# Patient Record
Sex: Female | Born: 1995 | Race: Black or African American | Hispanic: No | Marital: Single | Smoking: Never smoker
Health system: Southern US, Community
[De-identification: ages and names within clinical notes are randomized; demographics above are authoritative.]

## PROBLEM LIST (undated history)

## (undated) DIAGNOSIS — J45909 Unspecified asthma, uncomplicated: Secondary | ICD-10-CM

## (undated) DIAGNOSIS — T7840XA Allergy, unspecified, initial encounter: Secondary | ICD-10-CM

## (undated) DIAGNOSIS — F419 Anxiety disorder, unspecified: Secondary | ICD-10-CM

## (undated) HISTORY — PX: KNEE ARTHROSCOPY: SUR90

## (undated) HISTORY — DX: Allergy, unspecified, initial encounter: T78.40XA

## (undated) HISTORY — DX: Unspecified asthma, uncomplicated: J45.909

## (undated) HISTORY — DX: Anxiety disorder, unspecified: F41.9

---
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
## 2015-01-28 ENCOUNTER — Inpatient Hospital Stay: Payer: BLUE CROSS/BLUE SHIELD

## 2015-01-28 ENCOUNTER — Encounter (HOSPITAL_COMMUNITY): Payer: BLUE CROSS/BLUE SHIELD

## 2015-01-28 ENCOUNTER — Inpatient Hospital Stay
Admission: AD | Admit: 2015-01-28 | Discharge: 2015-02-04 | DRG: 865 | Disposition: A | Discharge status: Home / Self Care | Payer: BLUE CROSS/BLUE SHIELD | Source: Other Acute Inpatient Hospital | Attending: Internal Medicine | Admitting: Internal Medicine

## 2015-01-28 ENCOUNTER — Inpatient Hospital Stay: Payer: BLUE CROSS/BLUE SHIELD | Admitting: Critical Care Medicine

## 2015-01-28 ENCOUNTER — Other Ambulatory Visit: Payer: Self-pay

## 2015-01-28 DIAGNOSIS — J302 Other seasonal allergic rhinitis: Secondary | ICD-10-CM | POA: Diagnosis present

## 2015-01-28 DIAGNOSIS — G0490 Encephalitis and encephalomyelitis, unspecified: Secondary | ICD-10-CM

## 2015-01-28 DIAGNOSIS — Z781 Physical restraint status: Secondary | ICD-10-CM

## 2015-01-28 DIAGNOSIS — G92 Toxic encephalopathy: Secondary | ICD-10-CM | POA: Diagnosis present

## 2015-01-28 DIAGNOSIS — R569 Unspecified convulsions: Secondary | ICD-10-CM | POA: Diagnosis present

## 2015-01-28 DIAGNOSIS — J9600 Acute respiratory failure, unspecified whether with hypoxia or hypercapnia: Secondary | ICD-10-CM

## 2015-01-28 DIAGNOSIS — R339 Retention of urine, unspecified: Secondary | ICD-10-CM | POA: Diagnosis not present

## 2015-01-28 DIAGNOSIS — R451 Restlessness and agitation: Secondary | ICD-10-CM | POA: Diagnosis not present

## 2015-01-28 DIAGNOSIS — R197 Diarrhea, unspecified: Secondary | ICD-10-CM | POA: Diagnosis not present

## 2015-01-28 DIAGNOSIS — G039 Meningitis, unspecified: Secondary | ICD-10-CM

## 2015-01-28 DIAGNOSIS — J9811 Atelectasis: Secondary | ICD-10-CM | POA: Diagnosis present

## 2015-01-28 DIAGNOSIS — B2790 Infectious mononucleosis, unspecified without complication: Secondary | ICD-10-CM | POA: Diagnosis present

## 2015-01-28 DIAGNOSIS — B2782 Other infectious mononucleosis with meningitis: Principal | ICD-10-CM | POA: Diagnosis present

## 2015-01-28 DIAGNOSIS — D649 Anemia, unspecified: Secondary | ICD-10-CM | POA: Diagnosis present

## 2015-01-28 DIAGNOSIS — R4182 Altered mental status, unspecified: Secondary | ICD-10-CM

## 2015-01-28 LAB — BLOOD GAS, ARTERIAL
Arterial Total CO2: 21.2 mEq/L — ABNORMAL LOW (ref 24.0–30.0)
Base Excess, Arterial: -3.6 mEq/L — ABNORMAL LOW (ref <=2.0)
FIO2: 50 %
HCO3, Arterial: 20.2 mEq/L — ABNORMAL LOW (ref 23.0–29.0)
O2 Sat, Arterial: >100 % (ref 95.0–100.0)
PEEP: 5
Rate: 12
Temperature: 39.2
Tidal vol.: 420
pCO2, Arterial: 37.8 mmhg (ref 35.0–45.0)
pH, Arterial: 7.359 (ref 7.350–7.450)
pO2, Arterial: 250 mmhg — ABNORMAL HIGH (ref 80.0–90.0)

## 2015-01-28 LAB — CBC
Hematocrit: 38.8 % (ref 37.0–47.0)
Hgb: 13 g/dL (ref 12.0–16.0)
MCH: 28.1 pg (ref 28.0–32.0)
MCHC: 33.5 g/dL (ref 32.0–36.0)
MCV: 83.8 fL (ref 80.0–100.0)
MPV: 11.1 fL (ref 9.4–12.3)
Nucleated RBC: 0 /100 WBC (ref 0–1)
Platelets: 216 10*3/uL (ref 140–400)
RBC: 4.63 10*6/uL (ref 4.20–5.40)
RDW: 15 % (ref 12–15)
WBC: 13.62 10*3/uL — ABNORMAL HIGH (ref 3.50–10.80)

## 2015-01-28 LAB — BASIC METABOLIC PANEL
BUN: 8 mg/dL (ref 7.0–19.0)
CO2: 19 mEq/L — ABNORMAL LOW (ref 22–29)
Calcium: 8.9 mg/dL (ref 8.5–10.5)
Chloride: 108 mEq/L (ref 100–111)
Creatinine: 1 mg/dL (ref 0.6–1.0)
Glucose: 136 mg/dL — ABNORMAL HIGH (ref 70–100)
Potassium: 4.3 mEq/L (ref 3.5–5.1)
Sodium: 138 mEq/L (ref 136–145)

## 2015-01-28 LAB — HCG QUANTITATIVE: hCG, Quant.: <1.2

## 2015-01-28 LAB — LACTIC ACID, PLASMA: Lactic Acid: 2.8 mmol/L — ABNORMAL HIGH (ref 0.2–2.0)

## 2015-01-28 LAB — PT/INR
PT INR: 1.2 — ABNORMAL HIGH (ref 0.9–1.1)
PT: 15.2 s — ABNORMAL HIGH (ref 12.6–15.0)

## 2015-01-28 MED ORDER — PROPOFOL 10 MG/ML IV EMUL (WRAP)
INTRAVENOUS | Status: AC
Start: 2015-01-28 — End: 2015-01-28
  Administered 2015-01-28: 40 ug/kg/min via INTRAVENOUS
  Filled 2015-01-28: qty 100

## 2015-01-28 MED ORDER — VANCOMYCIN HCL 1000 MG IV SOLR
1000.0000 mg | Freq: Two times a day (BID) | INTRAVENOUS | Status: DC
Start: 2015-01-28 — End: 2015-01-28

## 2015-01-28 MED ORDER — PROPOFOL INFUSION 10 MG/ML
0.0000 ug/kg/min | INTRAVENOUS | Status: DC
Start: 2015-01-28 — End: 2015-01-30
  Administered 2015-01-28 – 2015-01-29 (×5): 40 ug/kg/min via INTRAVENOUS
  Administered 2015-01-29: 20 ug/kg/min via INTRAVENOUS
  Filled 2015-01-28 (×5): qty 100

## 2015-01-28 MED ORDER — LEVETIRACETAM 500 MG PO TABS
500.0000 mg | ORAL_TABLET | Freq: Two times a day (BID) | ORAL | Status: DC
Start: 2015-01-28 — End: 2015-01-28

## 2015-01-28 MED ORDER — SODIUM CHLORIDE 0.9 % IV SOLN
10.0000 mg/kg | Freq: Three times a day (TID) | INTRAVENOUS | Status: DC
Start: 2015-01-28 — End: 2015-02-04
  Administered 2015-01-28 – 2015-02-04 (×23): 550 mg via INTRAVENOUS
  Filled 2015-01-28 (×26): qty 11

## 2015-01-28 MED ORDER — SODIUM CHLORIDE 0.9 % IV SOLN
10.0000 mg/kg | Freq: Three times a day (TID) | INTRAVENOUS | Status: DC
Start: 2015-01-28 — End: 2015-01-28

## 2015-01-28 MED ORDER — HEPARIN SODIUM (PORCINE) 5000 UNIT/ML IJ SOLN
5000.0000 [IU] | Freq: Three times a day (TID) | INTRAMUSCULAR | Status: DC
Start: 2015-01-28 — End: 2015-01-29
  Administered 2015-01-28 – 2015-01-29 (×4): 5000 [IU] via SUBCUTANEOUS
  Filled 2015-01-28 (×4): qty 1

## 2015-01-28 MED ORDER — LORAZEPAM 2 MG/ML IJ SOLN
4.0000 mg | INTRAMUSCULAR | Status: DC | PRN
Start: 2015-01-28 — End: 2015-02-04

## 2015-01-28 MED ORDER — SODIUM CHLORIDE 0.9 % IV MBP
2.0000 g | Freq: Two times a day (BID) | INTRAVENOUS | Status: DC
Start: 2015-01-28 — End: 2015-01-29
  Administered 2015-01-28 (×2): 2 g via INTRAVENOUS
  Filled 2015-01-28 (×4): qty 2000

## 2015-01-28 MED ORDER — FAMOTIDINE 20 MG PO TABS
20.0000 mg | ORAL_TABLET | Freq: Two times a day (BID) | ORAL | Status: DC
Start: 2015-01-28 — End: 2015-01-31

## 2015-01-28 MED ORDER — FAMOTIDINE 10 MG/ML IV SOLN (WRAP)
20.0000 mg | Freq: Two times a day (BID) | INTRAVENOUS | Status: DC
Start: 2015-01-28 — End: 2015-01-31
  Administered 2015-01-28 – 2015-01-31 (×7): 20 mg via INTRAVENOUS
  Filled 2015-01-28 (×6): qty 2

## 2015-01-28 MED ORDER — CHLORHEXIDINE GLUCONATE 0.12 % MT SOLN
15.0000 mL | Freq: Two times a day (BID) | OROMUCOSAL | Status: DC
Start: 2015-01-28 — End: 2015-02-04
  Administered 2015-01-28 – 2015-01-31 (×6): 15 mL via OROMUCOSAL
  Filled 2015-01-28 (×9): qty 15

## 2015-01-28 MED ORDER — FENTANYL CITRATE (PF) 50 MCG/ML IJ SOLN (WRAP)
50.0000 ug | INTRAMUSCULAR | Status: DC | PRN
Start: 2015-01-28 — End: 2015-02-04
  Administered 2015-02-01: 50 ug via INTRAVENOUS
  Filled 2015-01-28: qty 2

## 2015-01-28 MED ORDER — LEVETIRACETAM IN NACL 500 MG/100ML IV SOLN
500.0000 mg | Freq: Two times a day (BID) | INTRAVENOUS | Status: DC
Start: 2015-01-28 — End: 2015-01-28

## 2015-01-28 MED ORDER — LEVETIRACETAM IN NACL 1000 MG/100ML IV SOLN
1000.0000 mg | Freq: Two times a day (BID) | INTRAVENOUS | Status: DC
Start: 2015-01-28 — End: 2015-02-04
  Administered 2015-01-28 – 2015-02-04 (×16): 1000 mg via INTRAVENOUS
  Filled 2015-01-28 (×18): qty 100

## 2015-01-28 MED ORDER — ACETAMINOPHEN 325 MG PO TABS
650.0000 mg | ORAL_TABLET | ORAL | Status: DC | PRN
Start: 2015-01-28 — End: 2015-02-04
  Administered 2015-01-28 – 2015-02-03 (×6): 650 mg via ORAL
  Filled 2015-01-28 (×6): qty 2

## 2015-01-28 MED ORDER — FENTANYL 10 MCG/ML NS 100 ML (OUTSOURCED)
50.0000 ug/h | INTRAVENOUS | Status: DC
Start: 2015-01-28 — End: 2015-01-30
  Administered 2015-01-28 – 2015-01-29 (×3): 50 ug/h via INTRAVENOUS
  Filled 2015-01-28 (×2): qty 100

## 2015-01-28 MED ORDER — SODIUM CHLORIDE 0.9 % IV SOLN
INTRAVENOUS | Status: DC
Start: 2015-01-28 — End: 2015-01-29

## 2015-01-28 MED ORDER — VANCOMYCIN 1000 MG IN 250 ML NS IVPB VIAL-MATE (CNR)
1000.0000 mg | Freq: Two times a day (BID) | INTRAVENOUS | Status: DC
Start: 2015-01-28 — End: 2015-01-29
  Administered 2015-01-28 – 2015-01-29 (×3): 1000 mg via INTRAVENOUS
  Filled 2015-01-28 (×3): qty 250

## 2015-01-28 MED ORDER — FENTANYL 10 MCG/ML NS 100 ML (OUTSOURCED)
INTRAVENOUS | Status: AC
Start: 2015-01-28 — End: 2015-01-28
  Administered 2015-01-28: 50 ug/h via INTRAVENOUS
  Filled 2015-01-28: qty 100

## 2015-01-28 MED ORDER — ONDANSETRON HCL 4 MG/2ML IJ SOLN
4.0000 mg | Freq: Four times a day (QID) | INTRAMUSCULAR | Status: DC | PRN
Start: 2015-01-28 — End: 2015-02-04

## 2015-01-28 NOTE — H&P (Signed)
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 Went to urgent care on Wednesday dec 14, diag with Mono, given zofran, couldn't keep it down.  Altered mental status on Thursday dec 15 around 1500 and family decided tot take to ER. Had 3 seizures there and recieved ativan and keppra. Eventually intubated for airway protection.  TX to [facility].  No recent cough, sinuu symptoms, ear ache, fevers per mom.  No recent travel beyond her college.        ASSESSMENT               PLAN  Status epilepticus  Meningoencephalitis NEURO: emperic abx for herpes and bacterial coverage  F/up csf studies sent at OSH  Keppra, cEEG  MRI brain    -Goals:   Perfusion goals: map >65  Na+ goal:  135-145  Pain/Sedation: propfol  Drains: none  PT/OT/Speech/swallow eval N/A till extubated    CV: hemodynamics stable    PULM:  Acute reps failure  SBT once EEG confirms no  seizures    GI: npo  GI PPX pepcid    RENAL: maintenance fluids  I/O Goal:     ID: presumed  meningoencephalitis - emperic abx    HEME: daily cbc    SCDs  Pharmacologic ppx: ordered    ENDO/RHEUM: nai  Glucose: 120-180    Lines/Foley: unable to place foley due to clots  Daily labs: ordered    Family Meeting:  Patient is currently not able to make medical decisions.  Patients proxy is mom and participated in meeting.   Discussed current diagnoses, prognosis and goals of care.  Code status was also discussed and is full code .     Code status Full Code      DISPO: NSICU          Review of Systems   Not available      Past Medical Hx   Seasonal Allergies - takes prn  zyrtec    Past Surgical Hx:   Knee arthroscopy    Allergies   Review of patient's allergies indicates no known allergies.      Meds   None      Social Hx   Single  Goes to colleg in [place] - sophomore  No known tobacco or etoh per mom    Premorbid functional status: fully functional at baseline    Family Hx   Mom - lupus, fibromyalgia      Physical Exam:     Filed Vitals:    01/28/15 0331 01/28/15 0400 01/28/15 0500 01/28/15 0600   BP:  120/72 124/76 127/80   Pulse:  83 79 73   Temp:  102.6 F (39.2 C)     TempSrc:  Core     Resp:  17 12  14   Height:       Weight:       SpO2: 100% 100% 100% 100%           No intake or output data in the 24 hours ending 01/28/15 0303  Vent Settings:  Vent Settings  Vent Mode: PRVC  FiO2: 60 %  Resp Rate (Set): 12  Vt (Set, mL): 420 mL  PIP Observed (cm H2O): 14 cm H2O  PEEP/EPAP: 5 cm H20  Mean Airway Pressure: 7 cmH20      General Appearance:  Oral ETt  Mental status:  Sedation:  propofol                Opens eyes (spont/voice/pain/none):  To sternal rub      Orientation: uta  Language: uta          Cranial Nerves  Pupils  OS: size/reactivity: 2  mm            OD: size/reactivity: 2  mm                EOM(III/IV/VI):  Rich Reining                               Visual field: uta        Corneal(V/VII): intact        Face(VII): uta  Shoulder shrug/head turn(XI): uta         Cough/gag(IX/X): intact                     Uvula (IX/X): ett                 Tongue(XII): ett        MOTOR  Upper Extremity   Drift   LEFT(of 5) Moves spont         RIGHT(of 5) Moves psont        Lower Extremity    LEFT(of 5) Moves psont   RIGHT(of 5) Moves spont   Pronator drift: uta      HEENT: oral ett, ogtube  CV: regular  PULM: clear lungs  GI: non tender  Ext:   W/o edema    Labs:   Labs and ekg reviewed    Rads:   Radiological Imaging personally reviewed      So far today I have spent 45 minutes providing care for this patient excluding teaching and billable procedures, and not overlapping with any other  providers for the following:          Signed by: Herminio Heads, MD  Date/Time: 01/28/2015 3:03 AM  Neuro Critical Care.   Attending MD [phone] (24hr) or [phone]  Midlevel Provider [phone] or [phone]

## 2015-01-28 NOTE — UM Notes (Signed)
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 Went to urgent care on Wednesday dec 14, diag with Mono, given zofran, couldn't keep it down.  Altered mental status on Thursday dec 15 around 1500 and family decided tot take to ER. Had 3 seizures there and recieved ativan and keppra. Eventually intubated for airway protection.  TX to [facility].    102.4, 101, 15, 127/74      Admitted ICU  unit: VS q 1  hr, Telemetry and pulse oximetry: Vent, Sedation  on propofol and fentanyl  cVEEG monitoring, Zovirax 550 mg iv q 8 hr, Rocephin 2 gm iv q 12 hr, Keppra 1 gm iv q 12 hr, Vanc 1 gm iv q 12 hr,

## 2015-01-28 NOTE — Progress Notes (Signed)
LONG TERM MONITORING    As per order, EEG electrodes MRI/CT compatible were applied with collodion /paste in accordance to the International 10-20 system and gauze wrap placed for electrode security. The assigned nurse were informed of the recording protocol and the patient event button.

## 2015-01-28 NOTE — Plan of Care (Signed)
Problem: Neurological Deficit  Goal: Neurological status is stable or improving  Outcome: Progressing  Neurological status remained stable through shift.  Patient currently on sedation.  Pupils are briskly reactive.  Patient withdraws from pain on all extremities.  EGG setup at bidside.  Sedation not held and pupil checks only for 1600 exam due to shivering during prior exam.  Patient on warming blanket.  MRI scheduled for after 1900 01/28/2015.  See flowsheets for details.  Will continue to assess.

## 2015-01-28 NOTE — Progress Notes (Signed)
01/28/15 0947   Patient Type   Within 30 Days of Previous Admission? No   Medicare focused diagnosis patient? Not a Medicare focused diagnosis patient   Bundle patient? Not a bundle patient   Healthcare Decisions   Interviewed: (chart review, pt intubated and family not available)   Orientation/Decision Making Abilities of Patient Patient on ventilator   Advance Directive Patient does not have advance directive   Healthcare Agent Appointed No   Healthcare Agent's Name NA   Healthcare Agent's Phone Number NA   Additional Emergency Contacts? NA   Prior to admission   Prior level of function Independent with ADLs   Type of Residence Private residence   Home Layout Two level   Living Arrangements Family members;Parent   Name of Assisted Living Facility NA   Home Care/Community Services None   Name of Agency NA   Frequency of services NA   Prior SNF admission? (Detail) NA   Prior Rehab admission? (Detail) NA   Adult Protective Services (APS) involved? No   Discharge Planning   Support Systems Family members;Parent;Friends/neighbors   Patient expects to be discharged to: TBD (home?)   Anticipated [place] plan discussed with: None available   [place] discussion contact information: NA   Follow up appointment scheduled? No  (to be scheduled@time  of discharge)   Mode of transportation: Medicaid transport   Consults/Providers   OT Evalulation Needed 1   SLP Evaluation Needed 1   Outcome Palliative Care Screen Screened but did not meet criteria for intervention   Correct PCP listed in Epic? Yes   Important Message from Medicare Notice   Patient received 1st IMM Letter? n/a     Frederich Chick, LCSW  Case Management and Discharge Planning  [phone]

## 2015-01-28 NOTE — Consults (Addendum)
[facility] Neu[facility] Consultation Note                                       Date Time: 01/28/2015 9:49 AM  Patient Name: [name] [name],[name] [name]  Requesting Physician: Dinescu, Doristine Bosworth, MD  Date of Admission: 01/28/2015    CC / Reason for Consultation: New onset seizure in setting of meningitis    Assessment:   New onset seizures in a 19 y.o. female without past medical history who presents to the hospital 01/28/15 with altered mental status and 3 new onset witnessed seizures in setting of 3-4 days of headache and vomiting.     1. AMS, new onset seizures (GTC, status epilepticus)  CSF reported with WBC in the 20s and high protein, c/w aseptic meningoencephalitis.  Most likely viral at this time.    Plan:   Follow up cVEEG monitoring - awaiting to be put on. If no seizures can start weaning to extubate.  Continue Keppra 1000mg  BID  Continue medical management by primary team  Recommend MRI Brain WWO as new onset seizures  Supportive therapies as tolerated  Attending additions:  - continue empiric meningitis coverage until further CSF results obtained  - will f/u CSF studies sent from [facility]  - noted her brain MRI was ordered on admission as wo contrast. Changed it to wwo.    NEUROLOGY ATTENDING NOTE:  I have reviewed the history, imaging and pertinent test results. I have personally examined the patient and confirmed the major physical findings of the preceding NP/PA note. Agree with the assessment and plan as outlined by the mid-level provider above, with any additional considerations as noted below.     Pt seen this morning, mother at bedside. Mother has been with pt in the preceding days. HA started first on 12/12, then vomiting, then yesterday 12/15 began having confusion, saying the wrong things, etc, followed by convulsive seizures. No gaze deviation noted by mother.     Per verbal report from [facility] attending her CSF from OSH showed WBC in the 20s and very high protein. This is not documented in  her H&P, will need to f/u results. For now she is being empirically covered with acyclovir, ceftriaxone, vancomycin.   +mono tested 2 days ago in urgent care is of questionable significance.    No recent travel other than coming home from college.   No known sick contacts.    IMP: as noted above and modified with my additions.  RECS: as noted in the above plan, with my additions included.     Will continue to follow.  Please call with any questions/concerns - Neurology Consult Spectralink [phone].    Peggye Form, MD  Neurohospitalist, [facility] Neu[facility]  Spectralink [phone]  Neurology Consults: Spectralink [phone] (8a-4:30p Mon-Sun)    I have personally reviewed the patients history and 24 hour interval events, along with vitals, labs, and radiology studies. Activities involved in critical care time include reviewing tests and records, communication with critical care team, and discussion with family.     So far today I have spent 45 minutes of critical care time, not overlapping with any other providers and excluding any procedures or teaching time, to treat the following life-threatening illnesses and prevent further deterioration of patient's condition: status epilepticus, meningitis, respiratory failure    HPI   [name] [name] is a 19 y.o. female without past medical history who presents to the  hospital 01/28/15 with  Altered mental status and 3 new onset witnessed seizures in setting of 3-4 days of headache and vomiting.  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  She developed a headache that started 12/12, and she started having recurrent vomiting on 12/13.  Patient went to urgent care on Wednesday, 12/14 and was diagnosed with mono via blood test and she was given Zofran, which she could not keep down.  Patient developed altered mental status on 12/15 around 1500 and family took her to the ED and she had 3 witnessed general tonic-clonic seizures. Did not return to baseline in between. Patient  received Ativan and Keppra and was intubated for airway protection and transferred to [facility] from [facility].  Patient's mother reported that of the seizures that she witnessed the 1st one was a long general tonic-clonic seizure that she reported lasted for several minutes.  The 2nd two seizures were shorter but still general tonic-clonic.  Patient's mother reports that she had 1 febrile seizure as a child.  She reports that the patient's cousin has a seizure history, but his seizures stopped before the age of 6.  Mother does not note any sick contacts, but reports that she has not yet called her daughter's roommates as they are likely still asleep at 10:00 AM.    Pt had an LP done at [facility] ER, CSF results are not documented in our system.     Past Medical Hx   Febrile seizure x1 in childhood    Past Surgical Hx:   No past surgical history on file.     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 Authorizing Provider   albuterol (PROVENTIL) (2.5 MG/3ML) 0.083% nebulizer solution Take 2.5 mg by nebulization every 6 (six) hours as needed for Wheezing.   Yes [provider]      Inpatient :   Current Facility-Administered Medications   Medication Dose Route Frequency   . acyclovir  10 mg/kg (Order-Specific) Intravenous Q8H   . cefTRIAXone (ROCEPHIN) IVPB 1 g  2 g Intravenous Q12H SCH   . chlorhexidine  15 mL Mouth/Throat Q12H   . famotidine  20 mg Oral Q12H SCH    Or   . famotidine  20 mg Intravenous Q12H SCH   . heparin (porcine)  5,000 Units Subcutaneous Q8H SCH   . levETIRAcetam  1,000 mg Intravenous Q12H   . vancomycin  1,000 mg Intravenous Q12H         Allergies    Review of patient's  allergies indicates no known allergies.      Review of Systems   All other systems were reviewed and are negative except for that mentioned in the HPI    Physical Exam:   Temp:  [99.4 F (37.4 C)-102.6 F (39.2 C)] 99.4 F (37.4 C)  Heart Rate:  [70-101] 70  Resp Rate:  [12-17] 12  BP: (114-127)/(72-80) 114/78 mmHg  FiO2:  [40 %-60 %] 40 %     Vital Signs:  Reviewed    General: The patient was well developed  and well nourished.  No acute distress.  Not cooperative with the exam.  Does not follow commands  Neck:  no carotid bruits  CVS: RRR, no murmurs, rubs,or gallops  Resp: CTA bilaterally, no retractions  Abd: Soft, nontender  Extremities: no pedal edema, extremities normal in color    Mental Status: The patient was intubated and sedated on propofol and fentanyl, and on the ventilator.  UTA orientation.  Affect is normal  [employer] of knowledge or recent and remote memory   Attention span and concentration appear poor  There is no evidence of conversational speech.    Cranial nerves:   -CN II: Corneals are intact  -CN III, IV, VI: Pupils equal, round, and reactive to light at 2 mm; no nystagmus              -CN V: UTA Facial sensation  in V1 through V3 distributions   -CN VII: Face symmetric around ET tube  -CN VIII: UTA hearing    -CN IX, X: No phonation   -CN XI: UTA strength of sternocleidomastoid and trapezius muscles   -CN XII: Tongue does not protrude    Motor: Muscle tone normal without spasticity or flaccidity. No atrophy.  No pronator drift.  UTA strength formally due to patient condition    Sensory:   UTA due to patient condition.  Patient withdraws all extremities to noxious stimuli    Reflexes: All reflexes were hyper active at 3+ throughout both upper and lower extremities bilaterally Plantars: Flexor bilaterally  Right foot had continuous clonus left foot had 3 beats of clonus before extinction    Coordination: No truncal ataxia.  No tremors    Gait: Deferred due to concern for patient  safety    Labs:     Results     Procedure Component Value Units Date/Time    MRSA culture [[id number]] Collected:  01/28/15 0317    Specimen Information:  Body Fluid from Nares Updated:  01/28/15 0640    Basic Metabolic Panel [[id number]]  (Abnormal) Collected:  01/28/15 0316    Specimen Information:  Blood Updated:  01/28/15 0428     Glucose 136 (H) mg/dL      BUN 8.0 mg/dL      Creatinine 1.0 mg/dL      Calcium 8.9 mg/dL      Sodium 621 mEq/L      Potassium 4.3 mEq/L      Chloride 108 mEq/L      CO2 19 (L) mEq/L     Beta HCG, Quant, Serum [[id number]] Collected:  01/28/15 0316     hCG, Quant. <1.2 Updated:  01/28/15 0424    Prothrombin time/INR [[id number]]  (Abnormal) Collected:  01/28/15 0317    Specimen Information:  Blood Updated:  01/28/15 0354     PT 15.2 (H) sec      PT INR 1.2 (H)      PT Anticoag. Given Within 48 hrs. None     CBC [[id number]]  (Abnormal) Collected:  01/28/15 0316    Specimen Information:  Blood from Blood Updated:  01/28/15 0351     WBC 13.62 (H) x10 3/uL      Hgb 13.0 g/dL      Hematocrit 25.3 %      Platelets 216 x10 3/uL      RBC 4.63 x10 6/uL      MCV 83.8 fL      MCH 28.1 pg      MCHC 33.5 g/dL  RDW 15 %      MPV 11.1 fL      Nucleated RBC 0 /100 WBC     Blood gas, arterial [[id number]]  (Abnormal) Collected:  01/28/15 0326    Specimen Information:  Blood, Arterial Updated:  01/28/15 0339     pH, Arterial 7.359      pCO2, Arterial 37.8 mmhg      pO2, Arterial 250.0 (H) mmhg      HCO3, Arterial 20.2 (L) mEq/L      Arterial Total CO2 21.2 (L) mEq/L      Base Excess, Arterial -3.6 (L) mEq/L      O2 Sat, Arterial >100.0 %      ABG CollectionSite Right Radl      Temperature 39.2      FIO2 50 %      Rate 12      Mode: PRVC      PEEP 5      Tidal vol. 420     Lactic acid, plasma [[id number]]  (Abnormal) Collected:  01/28/15 0319    Specimen Information:  Blood Updated:  01/28/15 0336     Lactic acid 2.8 (H) mmol/L           Rads:   No results found for this or any previous visit.    Signed  by,  Norvel Richards. Clemon Chambers, FNP-BC  Nurse Practitioner  [facility]  Pager: [phone]  Philis Kendall [phone]  After Hours: [phone]    Please see attending Neurologist note that accompanies this mid-level encounter note.

## 2015-01-28 NOTE — Plan of Care (Signed)
Problem: Safety  Goal: Patient will be free from injury during hospitalization  Outcome: Progressing  HOB 30 degree, purposeful rounding, family members at bedside, visiting and updated, floor mats present    Problem: Neurological Deficit  Goal: Neurological status is stable or improving  Outcome: Progressing  Neurocheck q1hr, open eyes to voice/spontaneous, unable to follow commands, pupils 4 round equal and brisk, antigravity to all extremities, non purposeful movement. MAP remains in goals.     Patient came from OSH with a foley, bloody urine noted, urine leaks round foley, removed. Attempted to place 16 Fr and 14 fr. Unable to advance. Minimal urine noted in foley cath. Clots noted. Told dr Danelle Earthly. Bladder scan. 155 ml.

## 2015-01-29 ENCOUNTER — Inpatient Hospital Stay: Payer: BLUE CROSS/BLUE SHIELD

## 2015-01-29 DIAGNOSIS — G9340 Encephalopathy, unspecified: Secondary | ICD-10-CM

## 2015-01-29 DIAGNOSIS — A419 Sepsis, unspecified organism: Secondary | ICD-10-CM

## 2015-01-29 LAB — BASIC METABOLIC PANEL
BUN: 10 mg/dL (ref 7.0–19.0)
CO2: 18 mEq/L — ABNORMAL LOW (ref 22–29)
Calcium: 7.9 mg/dL — ABNORMAL LOW (ref 8.5–10.5)
Chloride: 113 mEq/L — ABNORMAL HIGH (ref 100–111)
Creatinine: 0.8 mg/dL (ref 0.6–1.0)
Glucose: 90 mg/dL (ref 70–100)
Potassium: 3.6 mEq/L (ref 3.5–5.1)
Sodium: 140 mEq/L (ref 136–145)

## 2015-01-29 LAB — CBC
Hematocrit: 33 % — ABNORMAL LOW (ref 37.0–47.0)
Hgb: 10.8 g/dL — ABNORMAL LOW (ref 12.0–16.0)
MCH: 27.6 pg — ABNORMAL LOW (ref 28.0–32.0)
MCHC: 32.7 g/dL (ref 32.0–36.0)
MCV: 84.4 fL (ref 80.0–100.0)
MPV: 11.5 fL (ref 9.4–12.3)
Nucleated RBC: 0 /100 WBC (ref 0–1)
Platelets: 214 10*3/uL (ref 140–400)
RBC: 3.91 10*6/uL — ABNORMAL LOW (ref 4.20–5.40)
RDW: 16 % — ABNORMAL HIGH (ref 12–15)
WBC: 12.38 10*3/uL — ABNORMAL HIGH (ref 3.50–10.80)

## 2015-01-29 MED ORDER — SODIUM PHOSPHATE 3 MMOLE/ML IV SOLN
15.0000 mmol | INTRAVENOUS | Status: DC | PRN
Start: 2015-01-29 — End: 2015-02-03

## 2015-01-29 MED ORDER — ENOXAPARIN SODIUM 40 MG/0.4ML SC SOLN
40.0000 mg | Freq: Every day | SUBCUTANEOUS | Status: DC
Start: 2015-01-29 — End: 2015-02-04
  Administered 2015-01-29 – 2015-02-03 (×6): 40 mg via SUBCUTANEOUS
  Filled 2015-01-29 (×6): qty 0.4

## 2015-01-29 MED ORDER — GADOBUTROL 1 MMOL/ML IV SOLN
0.1000 mmol/kg | Freq: Once | INTRAVENOUS | Status: AC | PRN
Start: 2015-01-29 — End: 2015-01-29
  Administered 2015-01-29: 5.7 mmol via INTRAVENOUS

## 2015-01-29 MED ORDER — SODIUM PHOSPHATE 3 MMOLE/ML IV SOLN
25.0000 mmol | INTRAVENOUS | Status: DC | PRN
Start: 2015-01-29 — End: 2015-02-03

## 2015-01-29 MED ORDER — SENNOSIDES 17.6MG/10ML UNIT DOSE SYRINGE
17.6000 mg | ORAL_SOLUTION | Freq: Two times a day (BID) | ORAL | Status: DC
Start: 2015-01-29 — End: 2015-02-04
  Administered 2015-01-29 – 2015-02-01 (×2): 17.6 mg via ORAL
  Filled 2015-01-29 (×14): qty 10

## 2015-01-29 MED ORDER — POTASSIUM CHLORIDE 10 MEQ/100ML IV SOLN
10.0000 meq | INTRAVENOUS | Status: DC | PRN
Start: 2015-01-29 — End: 2015-02-03
  Administered 2015-01-29 (×2): 10 meq via INTRAVENOUS
  Filled 2015-01-29: qty 100

## 2015-01-29 MED ORDER — SODIUM CHLORIDE 0.9 % IV SOLN
1.0000 g | INTRAVENOUS | Status: DC | PRN
Start: 2015-01-29 — End: 2015-01-30

## 2015-01-29 MED ORDER — MAGNESIUM SULFATE IN D5W 10-5 MG/ML-% IV SOLN
1.0000 g | INTRAVENOUS | Status: DC | PRN
Start: 2015-01-29 — End: 2015-02-03

## 2015-01-29 MED ORDER — SODIUM PHOSPHATE 3 MMOLE/ML IV SOLN
35.0000 mmol | INTRAVENOUS | Status: DC | PRN
Start: 2015-01-29 — End: 2015-02-03

## 2015-01-29 NOTE — Progress Notes (Addendum)
[facility] Consult Progress Note    Date Time: 01/29/2015   Patient Name: [name] [name]  Outpatient Neurologist: none    CC: headache, AMS    Assessment:   New onset seizures in a 19 y.o. AAF with no PMH who presents 01/27/15 with AMS followed by new onset seizures x3 in setting of 3-4 days of headache and vomiting. +Mono.    1. Aseptic meningitis - presented with AMS, new onset seizures (GTC, status epilepticus)  Most likely viral at this time based on CSF profile.     CSF [facility] 01/27/15, lab # [phone] (micro):   Clear, colorless  Tube 4: RBC 0, WBC 27 (2 seg, 70 lymph, 28 mono), protein 247.9, glucose 75  Bacterial cx/GS: no growth @ 24h reported 12/16, GS: few WBCs, no organisms seen  Pending: HSV PCR in process  Blood cx: no growth @ 24h    Recommendations:   Continue cvEEG monitoring as sedation weaned  Ok to start ventilator weaning trials  Continue acyclovir until HSV results return, ok from my standpoint to d/c bacterial meningitis coverage  Continue Keppra 1000mg  BID  Will f/u MRI brain read. Per my review no significant acute findings.  Called OSH lab this morning - CSF results as above.   - CSF HSV PCR, pending    Will continue to follow.   Please call with any questions/concerns.   Neurology Spectralink [phone].    Peggye Form, MD  Neurohospitalist, [facility]  Spectralink [phone]  Neurology Consults: Spectralink [phone] (8a-4:30p Mon-Sun)    Interval History/Subjective:   No acute overnight events. Remains intubated, on propofol & fentanyl.  Parents at bedside.     MRI brain done early this morning, radiology read pending. On my prelim review - no lesions, edema. Possibly some abnormal meningeal enhancement?   cvEEG on my prelim spot check overnight was uneventful, no seizures. Formal report pending.    Medications:     Current Facility-Administered Medications   Medication Dose Route Frequency   . acyclovir  10 mg/kg (Order-Specific) Intravenous Q8H   . cefTRIAXone (ROCEPHIN) IVPB  1 g  2 g Intravenous Q12H SCH   . chlorhexidine  15 mL Mouth/Throat Q12H   . famotidine  20 mg Oral Q12H SCH    Or   . famotidine  20 mg Intravenous Q12H SCH   . heparin (porcine)  5,000 Units Subcutaneous Q8H SCH   . levETIRAcetam  1,000 mg Intravenous Q12H   . vancomycin  1,000 mg Intravenous Q12H       Review of Systems:   Neurological ROS negative except for that noted in the History/Subjective.    Physical Exam:   Temp:  [97.9 F (36.6 C)-100.2 F (37.9 C)] 98.4 F (36.9 C)  Heart Rate:  [60-100] 100  Resp Rate:  [12] 12  BP: (92-129)/(50-88) 117/74 mmHg  FiO2:  [40 %] 40 %    Vital Signs: Reviewed.    General: well developed, well nourished young woman.No acute distress.  CV: regular rate and rhythm  Resp: on the ventilator    INTUBATED, ON PROPOFOL & FENTANYL - NOT HELD FOR EXAM    Mental Status: eyes open with noxious stim, stay open but does not regard or track. Does not follow commands.     Cranial nerves: PERRL. No gaze preference/deviation. Face symmetric around ETT.     Motor: Muscle tone normal. Withdraws to pain x4    Sensory: intact to light noxious stim x4    Reflexes: symmetric,  2+ throughout UE and LE. 3 beats ankle clonus on L, 2 beats on R. Toes downgoing bilaterally.     Coordination: FTN and RAM cannot be tested.    Gait: unable to assess due to intubated status.    Labs:     Results     Procedure Component Value Units Date/Time    MRSA culture [[id number]] Collected:  01/28/15 0317    Specimen Information:  Body Fluid from Nasal/Throat ASC Admission Updated:  01/29/15 0840    Narrative:      ORDER#: [id number]                                    ORDERED BY: Herminio Heads  SOURCE: Nares and Throat                             COLLECTED:  01/28/15 03:17  ANTIBIOTICS AT COLL.:                                RECEIVED :  01/28/15 06:40  Culture MRSA Surveillance                  FINAL       01/29/15 08:40  01/29/15   Negative for Methicillin Resistant Staph aureus from Nares and              Negative for Methicillin Resistant Staph aureus from Throat      Basic Metabolic Panel [[id number]]  (Abnormal) Collected:  01/29/15 0400    Specimen Information:  Blood Updated:  01/29/15 0500     Glucose 90 mg/dL      BUN 25.3 mg/dL      Creatinine 0.8 mg/dL      Calcium 7.9 (L) mg/dL      Sodium 664 mEq/L      Potassium 3.6 mEq/L      Chloride 113 (H) mEq/L      CO2 18 (L) mEq/L     CBC [[id number]]  (Abnormal) Collected:  01/29/15 0400    Specimen Information:  Blood from Blood Updated:  01/29/15 0454     WBC 12.38 (H) x10 3/uL      Hgb 10.8 (L) g/dL      Hematocrit 56.3 (L) %      Platelets 214 x10 3/uL      RBC 3.91 (L) x10 6/uL      MCV 84.4 fL      MCH 27.6 (L) pg      MCHC 32.7 g/dL      RDW 16 (H) %      MPV 11.5 fL      Nucleated RBC 0 /100 WBC           Rads:   Xr Chest Ap Portable (single View) Once Et/ng/og Tube And Line Placement    01/28/2015    1.  The endotracheal tube tip is 3 cm above the carina. 2.  The enteric tube tip is at the gastric antrum. 3.  Minimal left basilar atelectasis. Demetrios Isaacs, MD 01/28/2015 8:56 AM         Signed by:  Peggye Form

## 2015-01-29 NOTE — Consults (Signed)
NUTRITION:    Reason for Assessment: Tube feeding    Recommend:  Promote goal of 65 ml/hr (1560 kcals / 98 gm pro / 1309 ml free water)    Clinical Update:  [name] [name] is a(n) 19 y.o. female Meningitis, aseptic, possible viral with seizures and encephalopathy. Intubated for acute resp failure    Labs: Calcium 7.9     Meds: pepcid, senekot, zovirax    Anthropometrics:  Height: 160 cm (5' 2.99")  Weight: 62.8 kg (138 lb 7.2 oz)  Body mass index is 24.53 kg/(m^2).    Nutrition Goals: 1500-1764 kcals ( 24-28 kcals/kg); 76-95 gm pro ( 1.2-1.5 gm pro)    Diet / Nutrition Support Order: Promote @ 20 ml/hr goal of 50 ml  (1200 kcals /  75 gm pro)    Nutrition Diagnosis:   Inadequate oral intake related to critical illness as evidenced by intubation status and TF     Monitor/Eval:  Monitor nutr support goals, labs, GI, med tx plan    Madison Hickman,  MS RD CDE CSO  [phone]

## 2015-01-29 NOTE — Plan of Care (Signed)
Agitation noted at shift change. Pt not following commands but moving all extremities against resistance. Pt is now on cpap for approx 1 hr. Family at bedside. Report given to Zella Ball, Charity fundraiser.

## 2015-01-29 NOTE — Progress Notes (Signed)
Neurocritical Care Daily Progress Note    Patient Name: Holsworth,Beanca  Room:F707/F707.01 Date Time: 01/29/2015 9:27 AM   Admit Date: 01/28/2015    Hospital LOS# 1  No chief complaint on file.      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 Went to urgent care on Wednesday dec 14, diag with Mono, given zofran, couldn't keep it down.  Altered mental status on Thursday dec 15 around 1500 and family decided tot take to ER. Had 3 seizures there and recieved ativan and keppra. Eventually intubated for airway protection. TX to [facility].  No recent cough, sinuu symptoms, ear ache, fevers per mom.  No recent travel beyond her college.      24 hr Events: cEEG placed yesterday in am. Had MRI. On PRVC, no weaning. On Propofol and Fentanyl gtt. Shivering off sedation. LGF. SBP stable. Foley reinserted due to urinary retention. Minimal secretions. No diarrhea, no rashes.    ASSESSMENT                               PLAN  Meningitis, likely viral  Encephalopathy  Acute respiratory failure  Sepsis     NEURO: Meningitis, aseptic, possible viral with seizures and encephalopathy.  MRI with diffuse increase in FLAIR signal. Contiue ACV, d/c abx.  CSF profile from OSH - RBC 0, WBC 27 (2 seg, 70 lymph, 28 mono), protein 247.9, glucose 75; GS negative, Cx NGTD. HSV PCR pending.  Continue Keppra. Neurology f/u.  cEEG - encephalopathic tracing.    -Goals:   Perfusion goals: MAP>65  Na+ goal: 135-145  Sedation: taper Propofol and keep low dose Fentanyl.    PT/OT( bed rest, dangle feet, ambulate): no  Speech/swallow eval: no    CV: BP stable. D/c IVF.    PULM: Ac resp failure: start SBT if she tolerates to stay off sedation.  CxR - L base atelectasis.    GI:  GI PPX: pepcid  Nutrition: increase TF to goal.    RENAL: Nl function.   Urinary retention - Foley replaced last night.    ID: decreasing WBC with LGF in the setting of viral meningitis and sepsis  syndrome.    HEME: Anemia - mild, dilutional.  SCD's  Pharmacologic ppx: change to LMWH.    ENDO/RHEUM: BS controlled.  Glucose: goal 100 -180    Lines/Foley: piv/Foley    Daily labs/imaging: y.    Anticipated Discharge:   Family Meeting:Multidisciplinary conference led by myself on 12/17. Patient is currently not able to make medical decisions.  Patients POA is mother and participated in meeting. Discussed current diagnoses, prognosis and goals of care.  Code status was also discussed and is full.    VITALS, LINES, IMAGING, LABS, MICRO, MEDS, GOALS reviewed and d/w bedside nurse     Code status Full Code  Dispo:      Physical Exam:   Vitals reviewed in EPIC    General Appearance: sedated  Vent: PRVC       Lines:piv.        EVD no.  Foley: y       Surgical sites: n/a    NEURO EXAM:  Mental Status:  Sedation: Propofol and Fentanyl gtt held for 10 min.                Opens eyes: to voice      Orientation: n/a  Language:  Intubated, not FC          Cranial Nerves  Pupils  OS: size/reactivity: 3, r              OD: size/reactivity: 3, r                    EOM(III/IV/VI): not tracking                               Visual field: BTT        Corneal(V/VII): y        Face(VII): intubated         Cough/gag(IX/X): good cough, weak gag                          Motor:  UE   Deltoid  Bicep  Tricep  WE  WF  Grip    R  spont antigravity         L spont antigravity           LE   Hip F Hip E Knee F  Knee E  DF  PF    R  WD         L WD            Heart: reg S1S2       Lungs: coarse        Abd: soft, + bs        Ext: no    Edema; SCDs  Skin: no rashes    Medications:   Scheduled Meds: reviewed     Labs:   reviewed    Rads:   Radiological Imaging personally reviewed: brain MRI.    Condition(s) with high probability of imminent deterioration or death include:   Meningitis, likely viral  Encephalopathy  Acute respiratory failure  Sepsis    Life saving interventions include:  MV  Abx  AED and drips for seizures treatment      I  have personally reviewed the patient's history and 24 hour interval events, along with vitals, labs, radiology images . So far today I have spent 45 minutes providing care for this patient excluding teaching and billable procedures, and not overlapping with any other providers.    Signed by: Baruch Gouty, MD  Date/Time: 01/29/2015 9:27 AM

## 2015-01-29 NOTE — Plan of Care (Signed)
Problem: Neurological Deficit  Goal: Neurological status is stable or improving  Outcome: Progressing  Pt remains intubated and sedated with propofol; Pt opens eyes to stimulation; Spontaneous movement (flicker) in B/L UE; Withdrawal/flicker in toes when stimulated; Parents at bedside throughout the night; Pt tolerating TF; MRI brain completed; Continous EEG  Intervention: Monitor/assess Complex Neurological Assessment  See doc flowsheets  Intervention: Maintain safe environment  Floor mats at bedside; Siderails up X3  Intervention: Observe for seizure activity and initiate seizure precautions if indicated.  No seizure activity noted

## 2015-01-29 NOTE — Procedures (Addendum)
Continuous Video EEG on [name] [name]    Dates from 12/16 to 01/29/2015    Indication:  Seizures     Findings:  There is a 5-6 Hz theta discharge in a diffuse distribution with superimposed 2 Hz delta. There are no spike or sharp waves. There are no interhemispheric asymmetries. No behavioral events are noted.    Impression:  This is an abnormal continuous video EEG with moderate generalized slowing of the background consistent with a nonspecific encephalopathy. There are no epileptiform discharges, no seizures and no behavioral events. Clinical correlation is recommended.

## 2015-01-29 NOTE — Plan of Care (Addendum)
Events: Sedation held this morning. Pls see MAR. Pt progressively waking up throughout the shift. Started following commands around 1400 and cpap initiated at 1800. TF held but was within goal at 50 cc/hr w/o any residual when cpap was initiated. VSS, keeping MAP without any interventions. UO was progressively observed to be bloodier. [facility] aware. Irrigated with sterile water per Sherrian Divers PA. Foley remained in place.    Neuro exam:  OES  + cough and gag  + EOM's  Pupils 3 perrl  BUE antigravity to commands  BLE wiggles toes to commands but also observed to be moving spont.

## 2015-01-30 ENCOUNTER — Inpatient Hospital Stay: Payer: BLUE CROSS/BLUE SHIELD

## 2015-01-30 LAB — URINALYSIS, REFLEX TO MICROSCOPIC EXAM IF INDICATED
Bilirubin, UA: NEGATIVE
Glucose, UA: NEGATIVE
Ketones UA: >=80 — AB
Nitrite, UA: NEGATIVE
Protein, UR: 100 — AB
Specific Gravity UA: 1.013 (ref 1.001–1.035)
Urine pH: 6 (ref 5.0–8.0)
Urobilinogen, UA: NORMAL mg/dL

## 2015-01-30 LAB — CBC
Hematocrit: 31.8 % — ABNORMAL LOW (ref 37.0–47.0)
Hgb: 10.6 g/dL — ABNORMAL LOW (ref 12.0–16.0)
MCH: 28 pg (ref 28.0–32.0)
MCHC: 33.3 g/dL (ref 32.0–36.0)
MCV: 84.1 fL (ref 80.0–100.0)
MPV: 11.1 fL (ref 9.4–12.3)
Nucleated RBC: 0 /100 WBC (ref 0–1)
Platelets: 209 10*3/uL (ref 140–400)
RBC: 3.78 10*6/uL — ABNORMAL LOW (ref 4.20–5.40)
RDW: 16 % — ABNORMAL HIGH (ref 12–15)
WBC: 9.56 10*3/uL (ref 3.50–10.80)

## 2015-01-30 LAB — BASIC METABOLIC PANEL
BUN: 6 mg/dL — ABNORMAL LOW (ref 7.0–19.0)
CO2: 19 mEq/L — ABNORMAL LOW (ref 22–29)
Calcium: 8.1 mg/dL — ABNORMAL LOW (ref 8.5–10.5)
Chloride: 110 mEq/L (ref 100–111)
Creatinine: 0.7 mg/dL (ref 0.6–1.0)
Glucose: 81 mg/dL (ref 70–100)
Potassium: 3.3 mEq/L — ABNORMAL LOW (ref 3.5–5.1)
Sodium: 140 mEq/L (ref 136–145)

## 2015-01-30 MED ORDER — ACETAMINOPHEN 650 MG RE SUPP
650.0000 mg | Freq: Four times a day (QID) | RECTAL | Status: DC | PRN
Start: 2015-01-30 — End: 2015-02-03
  Administered 2015-01-30: 650 mg via RECTAL
  Filled 2015-01-30: qty 1

## 2015-01-30 NOTE — Procedures (Signed)
Continuous Video EEG on [name] [name]    Dates from 12/17 to 01/30/2015    Indication: Seizures     Findings: There is a 5-6 Hz theta discharge in a diffuse distribution with superimposed 2 Hz delta. There are no spike or sharp waves. There are no interhemispheric asymmetries. No behavioral events are noted.    Impression: This is an abnormal continuous video EEG with moderate generalized slowing of the background consistent with a nonspecific encephalopathy. There are no epileptiform discharges, no seizures and no behavioral events. Clinical correlation is recommended.

## 2015-01-30 NOTE — Plan of Care (Signed)
Problem: Neurological Deficit  Goal: Neurological status is stable or improving  Outcome: Progressing  Pt remains intubated and sedated with propofol and fentanyl; CPAP trials completed at change of shift and pt became agitated after fentanyl and propofol were stopped; Vomited X1 and OGT came out; Dr. Dow Adolph aware and said to not put back in at this time; Pt placed back on sedation for comfort and placed back on PRVC settings; Pt Tmax 101.1 at 0000; Dr. Dow Adolph notifiied; Tylenol suppository given X1; Blood cultures X2 sent, UA and Cx sent, sputum sent; CXR completed this AM; Pt also placed back on cooling blanket; Continuous EEG  Intervention: Monitor/assess Glasgow Coma Scale.  GCS 10  Intervention: Observe for seizure activity and initiate seizure precautions if indicated.  No seizure activity noted during shift

## 2015-01-30 NOTE — Progress Notes (Signed)
Neurocritical Care Daily Progress Note    Patient Name: [name] [name],[name] [name]  Room:F707/F707.01 Date Time: 01/30/2015 12:04 PM   Admit Date: 01/28/2015    Hospital LOS# 2  No chief complaint on file.      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 Went to urgent care on Wednesday dec 14, diag with Mono, given zofran, couldn't keep it down.  Altered mental status on Thursday dec 15 around 1500 and family decided tot take to ER. Had 3 seizures there and recieved ativan and keppra. Eventually intubated for airway protection. TX to [facility].  No recent cough, sinuu symptoms, ear ache, fevers per mom.  No recent travel beyond her college.    24 hr Events:  No seizures, encephalopathic tracing on cEEG. Off sedation in am. Failed CPAP in pm due to agitation and vomiting. On PRVC o/n. Agitated with am CPAP trial. Febrile to 101.1C. SBP stable.     ASSESSMENT                               PLAN  Meningitis, likely viral  Encephalopathy  Acute respiratory failure  Sepsis     NEURO: Meningitis, aseptic, possible viral with seizures and encephalopathy.  MRI WNL. Contiue ACV untill HSV PCR available. D/c contact isolation.  CSF profile from OSH - RBC 0, WBC 27 (2 seg, 70 lymph, 28 mono), protein 247.9, glucose 75; GS negative, Cx negative. HSV PCR pending.  Continue Keppra. Neurology f/u.  cEEG - encephalopathic tracing.    -Goals:   Perfusion goals: MAP>65  Na+ goal: 135-145  Sedation: off    PT/OT( bed rest, dangle feet, ambulate): PROM.  Speech/swallow eval: no    CV: BP stable. D/c IVF.    PULM: Ac resp failure: passing CPAP; extubate as tolerated.  CxR - L base atelectasis.    GI:  GI PPX: pepcid  Nutrition: NPO.    RENAL: Nl function.   D/c Foley.    ID: NL WBC with fevers in the setting of viral meningitis and sepsis syndrome. HSV PCR pending. On ACV.    HEME: Anemia - mild, dilutional.  SCD's  Pharmacologic ppx: LMWH.    ENDO/RHEUM: BS  controlled.  Glucose: goal 100 -180    Lines/Foley: piv/Foley    Daily labs/imaging: y.    Anticipated Discharge:   Family Meeting: Mother updated at bedside.    VITALS, LINES, IMAGING, LABS, MICRO, MEDS, GOALS reviewed and d/w bedside nurse     Code status Full Code  Dispo: NSICU.    Physical Exam:   Vitals reviewed in EPIC    General Appearance: no sedation, agitated.  Vent: CPAP       Lines:piv.        EVD no.  Foley: y       Surgical sites: n/a    NEURO EXAM:  Mental Status:  Sedation: off             Opens eyes: spont      Orientation: n/a          Language:  Intubated, FC intermittently       Cranial Nerves  Pupils  OS: size/reactivity: 3, r              OD: size/reactivity: 3, r                    EOM(III/IV/VI):  tracking                               Visual field: BTT        Corneal(V/VII): y        Face(VII): intubated         Cough/gag(IX/X): good cough and gag                          Motor:  UE   Deltoid  Bicep  Tricep  WE  WF  Grip    R  spont antigravity         L spont antigravity           LE   Hip F Hip E Knee F  Knee E  DF  PF    R  spont antigravity         L spont antigravity            Heart: reg S1S2       Lungs: CTA        Abd: soft, + bs        Ext: no edema; SCDs  Skin: no rashes    Medications:   Scheduled Meds: reviewed     Labs:   reviewed    Rads:   Radiological Imaging personally reviewed: brain MRI.    Condition(s) with high probability of imminent deterioration or death include:   Meningitis, likely viral  Encephalopathy  Acute respiratory failure  Sepsis    Life saving interventions include:  MV  Abx  AEDs      I have personally reviewed the patient's history and 24 hour interval events, along with vitals, labs, radiology images . So far today I have spent 47 minutes providing care for this patient excluding teaching and billable procedures, and not overlapping with any other providers.    Signed by: Baruch Gouty, MD  Date/Time: 01/30/2015 12:04 PM

## 2015-01-30 NOTE — Progress Notes (Addendum)
[facility] Consult Progress Note    Date Time: 01/30/2015   Patient Name: [name],[name]  Outpatient Neurologist: none    CC: headache, AMS    Assessment:   New onset seizures in a 19 y.o. AAF with no PMH who presents 01/27/15 with AMS followed by new onset seizures x3 in setting of 3-4 days of headache and vomiting. +Mono at urgent care. S/p intubation at OSH, extubated 12/18.    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 01/27/15, lab # [phone] (micro):    Clear, colorless   Tube 4: RBC 0, WBC 27 (2 seg, 70 lymph, 28 mono), protein 247.9, glucose 75   Bacterial cx/GS: no growth @ 48h reported as of 12/18, GS: few WBCs, no organisms seen   Pending: HSV PCR in process as of 12/18       Blood cx: no growth @ 48h as of 12/18        Most likely viral etiology based on CSF profile.         MRI brain wwo 12/17 was normal.    2. New onset seizures, 2/2 #1  Stable, on Keppra started at OSH.    3. Encephalopathy - post-ictal vs medication effect vs meningoencephalitis related.  Has not had seizures on cvEEG 12/16-12/18, but EEG is very slow even post extubation.    Recommendations:   - [place] cvEEG - has been unremarkable from a seizure standpoint  - S/p extubation today AM, very altered - monitor clinically  - Continue Keppra 1000mg  BID  - Called OSH lab this morning - CSF cx neg x 48h, HSV PCR still pending  - Continue acyclovir until HSV results return. Bacterial meningitis coverage has been d/c'd.  - If the encephalopathy does not clear as expected would consider pursuing further work-up/tx for noninfectious meningoencephalitis    Will continue to follow.   Please call with any questions/concerns.   Neurology Spectralink [id number].    Peggye Form, MD  Neurohospitalist, [facility]  Spectralink [id number]  Neurology Consults: Spectralink [id number] (8a-4:30p Mon-Sun)    Interval History/Subjective:   Overnight febrile to 101.1.    Extubated this  morning at ~10am, evaluated shortly after extubation.   Parents at bedside. Has opened eyes but not yet spoken, not following commands.  Per RN she was following commands briskly while intubated. Propofol had been off since ~7am, was following commands as of ~9am then got somewhat agitated just prior to extubation but was NOT given any medications.     cvEEG continues to be unremarkable from a seizure standpoint - no focal abnormalities, no seizures. Prominent diffuse slowing - theta and delta. I reviewed cvEEG between 9am-11am just to make sure no seizure could explain her mental status change around extubation, and there was no concerning EEG activity. However, unclear why her EEG continues to be so slow post extubation.    Medications:     Current Facility-Administered Medications   Medication Dose Route Frequency   . acyclovir  10 mg/kg (Order-Specific) Intravenous Q8H   . chlorhexidine  15 mL Mouth/Throat Q12H   . enoxaparin  40 mg Subcutaneous Daily   . famotidine  20 mg Oral Q12H SCH    Or   . famotidine  20 mg Intravenous Q12H SCH   . levETIRAcetam  1,000 mg Intravenous Q12H   . senna  17.6 mg Oral BID       Review of Systems:   Neurological  ROS negative except for that noted in the History/Subjective.    Physical Exam:   Temp:  [98.3 F (36.8 C)-101.1 F (38.4 C)] 99.7 F (37.6 C)  Heart Rate:  [63-111] 79  Resp Rate:  [12-89] 20  BP: (105-132)/(56-81) 117/78 mmHg  FiO2:  [40 %] 40 %    Vital Signs: Reviewed.    General: well developed, well nourished young woman.Laying in bed curled up in a tight ball.   CV: regular rate and rhythm  Resp: breathing comfortably on nasal cannula    Mental Status: eyes open to voice, does not track me but seems to regard her parents sitting to her right. No verbal response to questions or commands. Does not follow commands. Said "ow" softly once to noxious stim.     Cranial nerves: PERRL. Gaze is conjugate, no preference of deviation noted. Face is symmetric.     Motor:  Muscle tone normal. Withdraws briskly to pain x4, seems to be moving both sides equally.    Sensory: grimaces to light noxious stim x4    Reflexes: symmetric, unable to test DTRs due to impaired relaxation. No ankle clonus (resolved). Toes downgoing bilaterally.     Coordination: FTN and RAM cannot be tested.    Gait: not safe to assess yet    Labs:     Results     Procedure Component Value Units Date/Time    Basic Metabolic Panel [[id number]]  (Abnormal) Collected:  01/30/15 0545    Specimen Information:  Blood Updated:  01/30/15 0642     Glucose 81 mg/dL      BUN 6.0 (L) mg/dL      Creatinine 0.7 mg/dL      Calcium 8.1 (L) mg/dL      Sodium 102 mEq/L      Potassium 3.3 (L) mEq/L      Chloride 110 mEq/L      CO2 19 (L) mEq/L     CULTURE + Dierdre Forth [[id number]] Collected:  01/30/15 0121    Specimen Information:  Sputum from Bronchial Brushing Updated:  01/30/15 0623    Narrative:      ORDER#: [id number]                                    ORDERED BY: Nancie Neas  SOURCE: Bronchial Brushing et                        COLLECTED:  01/30/15 01:21  ANTIBIOTICS AT COLL.:                                RECEIVED :  01/30/15 04:38  Stain, Gram (Respiratory)                  FINAL       01/30/15 06:23  01/30/15   No Epithelial cells             Few WBC's             No organisms seen  Culture and Gram Stain, Aerobic, RespiratorPENDING      CBC [[id number]]  (Abnormal) Collected:  01/30/15 0545    Specimen Information:  Blood from Blood Updated:  01/30/15 0618     WBC 9.56 x10 3/uL      Hgb 10.6 (L) g/dL  Hematocrit 31.8 (L) %      Platelets 209 x10 3/uL      RBC 3.78 (L) x10 6/uL      MCV 84.1 fL      MCH 28.0 pg      MCHC 33.3 g/dL      RDW 16 (H) %      MPV 11.1 fL      Nucleated RBC 0 /100 WBC     CULTURE BLOOD AEROBIC AND ANAEROBIC [[id number]] Collected:  01/30/15 0136    Specimen Information:  Blood, Venipuncture Updated:  01/30/15 0437    Narrative:      1 BLUE+1 PURPLE    CULTURE BLOOD AEROBIC  AND ANAEROBIC [[id number]] Collected:  01/30/15 0121    Specimen Information:  Blood, Intravenous Line Updated:  01/30/15 0437    Narrative:      1 BLUE+1 PURPLE    UA, Reflex to Microscopic [[id number]]  (Abnormal) Collected:  01/30/15 0121    Specimen Information:  Urine Updated:  01/30/15 0346     Urine Type Catheterized, F      Color, UA Red (A)      Clarity, UA Cloudy (A)      Specific Gravity UA 1.013      Urine pH 6.0      Leukocyte Esterase, UA Small (A)      Nitrite, UA Negative      Protein, UR 100 (A)      Glucose, UA Negative      Ketones UA >=80 (A)      Urobilinogen, UA Normal mg/dL      Bilirubin, UA Negative      Blood, UA Large (A)      RBC, UA TNTC (A) /hpf      WBC, UA 26 - 50 (A) /hpf           Rads:   Mri Brain W Wo Contrast    01/29/2015  1. The temporal lobes are normal. 2. No subarachnoid space abnormality is identified. 3. No structural fluid collection, cerebritis, or other worrisome process. Trilby Drummer, MD 01/29/2015 10:54 AM     Xr Chest Ap Portable    01/30/2015   Mild left basal atelectasis improving from the prior study. No new or progressive airspace opacity or other acute abnormality. Annabell Sabal, MD 01/30/2015 10:38 AM     Xr Chest Ap Portable (single View) Once Et/ng/og Tube And Line Placement    01/28/2015    1.  The endotracheal tube tip is 3 cm above the carina. 2.  The enteric tube tip is at the gastric antrum. 3.  Minimal left basilar atelectasis. Demetrios Isaacs, MD 01/28/2015 8:56 AM         Signed by:  Peggye Form

## 2015-01-31 DIAGNOSIS — B2790 Infectious mononucleosis, unspecified without complication: Secondary | ICD-10-CM

## 2015-01-31 DIAGNOSIS — R569 Unspecified convulsions: Secondary | ICD-10-CM | POA: Diagnosis present

## 2015-01-31 LAB — BASIC METABOLIC PANEL
BUN: 9 mg/dL (ref 7.0–19.0)
CO2: 18 mEq/L — ABNORMAL LOW (ref 22–29)
Calcium: 8.5 mg/dL (ref 8.5–10.5)
Chloride: 107 mEq/L (ref 100–111)
Creatinine: 0.7 mg/dL (ref 0.6–1.0)
Glucose: 69 mg/dL — ABNORMAL LOW (ref 70–100)
Potassium: 3.7 mEq/L (ref 3.5–5.1)
Sodium: 140 mEq/L (ref 136–145)

## 2015-01-31 LAB — MAN DIFF ONLY
Atypical Lymphocytes %: 23 %
Atypical Lymphocytes Absolute: 1.98 10*3/uL — ABNORMAL HIGH
Band Neutrophils Absolute: 0 10*3/uL (ref 0.00–1.00)
Band Neutrophils: 0 %
Basophils Absolute Manual: 0.08 10*3/uL (ref 0.00–0.20)
Basophils Manual: 1 %
Eosinophils Absolute Manual: 0.08 10*3/uL (ref 0.00–0.70)
Eosinophils Manual: 1 %
Lymphocytes Absolute Manual: 1.4 10*3/uL (ref 0.50–4.40)
Lymphocytes Manual: 16 %
Monocytes Absolute: 0.99 10*3/uL (ref 0.00–1.20)
Monocytes Manual: 11 %
Neutrophils Absolute Manual: 4.22 10*3/uL (ref 1.80–8.10)
Nucleated RBC: 0 /100 WBC (ref 0–1)
Segmented Neutrophils: 48 %

## 2015-01-31 LAB — CBC AND DIFFERENTIAL
Hematocrit: 34.4 % — ABNORMAL LOW (ref 37.0–47.0)
Hgb: 11.4 g/dL — ABNORMAL LOW (ref 12.0–16.0)
MCH: 27.6 pg — ABNORMAL LOW (ref 28.0–32.0)
MCHC: 33.1 g/dL (ref 32.0–36.0)
MCV: 83.3 fL (ref 80.0–100.0)
MPV: 10.4 fL (ref 9.4–12.3)
Platelets: 221 10*3/uL (ref 140–400)
RBC: 4.13 10*6/uL — ABNORMAL LOW (ref 4.20–5.40)
RDW: 15 % (ref 12–15)
WBC: 8.78 10*3/uL (ref 3.50–10.80)

## 2015-01-31 LAB — CELL MORPHOLOGY
Cell Morphology: ABNORMAL — AB
Platelet Estimate: NORMAL

## 2015-01-31 MED ORDER — SODIUM CHLORIDE 0.9 % IV SOLN
INTRAVENOUS | Status: DC
Start: 2015-01-31 — End: 2015-02-04

## 2015-01-31 NOTE — Progress Notes (Signed)
Worker met w/pt and family@ bedside on 01/28/15. Introduced self, explained CM role and provided contact information. Pt's Philip plan is not fully determined as of this writing; current recommendation from Speech/Language is Acute Rehab, recommendations from PT/OT remain outstanding. Writer to continue to follow, and referrals to be made as appropriate and necessary.    Frederich Chick, LCSW  Case Management and Discharge Planning  [phone]

## 2015-01-31 NOTE — Plan of Care (Signed)
Problem: Neurological Deficit  Goal: Neurological status is stable or improving  Outcome: Progressing  Pt extubated, able to nod appropriately to questions. Now speaking and oriented x 4; delayed responses and weak voice. Saturating well on RA, CV within goal with no interventions. Afebrile. Will continue to monitor.

## 2015-01-31 NOTE — Procedures (Signed)
Continuous Video EEG on [name] [name]    Dates from 12/18 to 01/31/2015    Indication: Seizures     Findings: There is a 5-6 Hz theta discharge in a diffuse distribution with superimposed 2 Hz delta. There are no spike or sharp waves. There are no interhemispheric asymmetries. No behavioral events are noted.    Impression: This is an abnormal continuous video EEG with moderate generalized slowing of the background consistent with a nonspecific encephalopathy. There are no epileptiform discharges and no seizures. No behavioral events are noted. Clinical correlation is recommended.

## 2015-01-31 NOTE — Progress Notes (Addendum)
Neurocritical Care Daily Progress Note    Patient Name: Ehrmann,Britlee  Room:F707/F707.01 Date Time: 01/31/2015 11:36 AM   Admit Date: 01/28/2015    Hospital LOS# 3  No chief complaint on file.      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 Headache starting Monday, recurrent vomitting from tuesday. Went to urgent care on Wednesday dec 14, diag with Mono, given zofran, couldn't keep it down.  Altered mental status on Thursday dec 15 around 1500 and family decided tot take to ER. Had 3 seizures there and recieved ativan and keppra. Eventually intubated for airway protection. TX to [facility].  No recent cough, sinuu symptoms, ear ache, fevers per mom.  No recent travel beyond her college.    12/18:  No seizures, encephalopathic tracing on cEEG. Off sedation in am. Failed CPAP in pm due to agitation and vomiting. On PRVC o/n. Agitated with am CPAP trial. Febrile to 101.1C. SBP stable.     24 hr Events: Extubated at 10AM yesterday, following commands last night, at 0400 started talking, delayed response. Speech eval this AM, remain NPO. No fevers.     ASSESSMENT                               PLAN  Meningitis, likely viral  Encephalopathy  Acute respiratory failure  Sepsis     NEURO: Meningitis, aseptic, possible viral with seizures and encephalopathy, no seizures on cEEG.  MRI WNL. Contiue ACV untill HSV PCR available.   CSF profile from OSH - RBC 0, WBC 27 (2 seg, 70 lymph, 28 mono), protein 247.9, glucose 75; GS negative, Cx negative. HSV PCR pendingm f/u from outside facility.   Continue Keppra. Neurology f/u.    -Goals:   Perfusion goals: MAP>65  Na+ goal: 135-145  Sedation: off    PT/OT( bed rest, dangle feet, ambulate): PROM.  Speech/swallow eval: seen by speech today, no cleared due to delayed speech/swallow. Keep NPO, she will be followed by speech.     CV: BP stable.     PULM: Ac resp failure: extubated yesterday, doing well on RA.  CxR - improved L base atelectasis.    GI:  GI PPX:  none  Nutrition: NPO. Speech is following    RENAL: Nl function.   Foley d/c'd yesterday. Start maintenance while NPO.   - NS 75cc/hr    ID: NL WBC. Concern of viral vs. Aseptic meningitis. Afebrile >24hrs, pan cx with no e/o infection. Continue acyclovir until HSV PCR results.     HEME: Anemia - mild, improving  SCD's  Pharmacologic ppx: LMWH.    ENDO/RHEUM: BS controlled.  Glucose: goal 100 -180    Lines/Foley: piv    Daily labs/imaging: y.    Anticipated Discharge: transfer today.   Family Meeting: Mother updated at bedside.    VITALS, LINES, IMAGING, LABS, MICRO, MEDS, GOALS reviewed and d/w bedside nurse     Code status Full Code  Dispo: NSICU.    Physical Exam:   Vitals reviewed in EPIC    General Appearance: extubated, resting  Lines:piv.        EVD no.  Foley: N  Surgical sites: n/a    NEURO EXAM:  Mental Status:  Sedation: off             Opens eyes: spont      Orientation: x4  Language:  FC, verba, delayed response, coherent    Cranial  Nerves  Pupils  OS: size/reactivity: 3, r              OD: size/reactivity: 3, r                    EOM(III/IV/VI): tracking                               Visual field: BTT        Corneal(V/VII): y        Face(VII): symmetric, intact      Cough/gag(IX/X): good cough and gag                          Motor:  UE   Deltoid  Bicep  Tricep  WE  WF  Grip    R  3/5         L 3/5           LE   Hip F Hip E Knee F  Knee E  DF  PF    R  3/5         L 3/5            Heart: reg S1S2       Lungs: CTA        Abd: soft, + bs        Ext: no edema; SCDs  Skin: no rashes    Medications:   Scheduled Meds: reviewed     Labs:   reviewed    Rads:   Radiological Imaging personally reviewed: brain MRI.    Condition(s) with high probability of imminent deterioration or death include:   Meningitis, likely viral  Encephalopathy  Sepsis    Life saving interventions include:  Abx  AEDs      I have personally reviewed the patient's history and 24 hour interval events, along with vitals, labs, radiology  images . So far today I have spent 34 minutes providing care for this patient excluding teaching and billable procedures, and not overlapping with any other providers.    Signed by: Ballard Russell, MD  Date/Time: 01/31/2015 11:36 AM      [facility] Attending Addendum    Pt seen and examined. Meds, labs and imagistic studies reviewed. Agree with the above A/P. Improving multifactorial encephalopathy (toxic metabolic - sepsis and postictal). Aseptic meningitis - on ACV. H flu pneumonia - on Ceftriaxone. Advance activity. Sp/sw eval pending.  Transfer to the floor.    CCM time x 35 min, not overlapping with other providers.    Jesusita Oka Lian Tanori MD  01/31/2015 3:12 PM

## 2015-01-31 NOTE — H&P (Signed)
ADMISSION HISTORY AND PHYSICAL EXAM    This patient is admitted to the CNS Hospitalist Service. A CNS MD is in-house 24/7. To contact the CNS MD, please page the listed attending CNS MD.  If no response, please page [phone] to reach the CNS MD on call.    Date Time: 01/31/2015 6:20 PM  Patient Name: [name] [name],[name] [name]  Attending Physician: Caesar Bookman, MD  Primary Care Physician: Valentino Nose, MD    CC: headache, N/V, mono, AMS, meningitis, seizure     Assessment:   Principal Problem:    Meningitis  Active Problems:    Seizure    Mononucleosis    19 yo f w/o significant PMH p/w headache, N/V, diagnosed w/ mono, p/w AMS, found to have meningitis, witnessed seizure x 3 in ER, started on keppra, acyclovir, intubated for airway protection, transferred from OSH to NSICU on 01/28/15, seen by neurology, extubated yesterday, no seizure on EEG, now stable for transfer to [facility].   Plan:   Transfer to [facility].  Transfer orders per [facility].  Neurology consult.  Continue current hospital meds: keppra, acyclovir.  SLP consult.  Lovenox for DVT prophylaxis.    History of Presenting Illness:   [name] [name] is a 19 y.o. female w/o significant PMH who initially presented w/ headache, N/V.  Pt was diagnosed w/ mono but presented w/ AMS.  Pt was found to have meningitis.  Pt had witnessed seizure x 3 in ER.  Pt was started on keppra, acyclovir, intubated for airway protection, transferred from OSH to NSICU on 01/28/15.  Pt was seen by neurology, extubated yesterday.  So far EEG has been showing encephalopathy but no seizure.  Pt is now stable for transfer to [name].  For Pt's ICU course, please refer to the following log.    12/17: cEEG placed yesterday in am. Had MRI. On PRVC, no weaning. On Propofol and Fentanyl gtt. Shivering off sedation. LGF. SBP stable. Foley reinserted due to urinary retention. Minimal secretions. No diarrhea, no rashes.    12/18: No seizures, encephalopathic tracing on cEEG. Off sedation in am. Failed CPAP in  pm due to agitation and vomiting. On PRVC o/n. Agitated with am CPAP trial. Febrile to 101.1C. SBP stable.     12/19: Extubated at 10AM yesterday, following commands last night, at 0400 started talking, delayed response. Speech eval this AM, remain NPO. No fevers.      Past Medical History:   No past medical history on file.    Available old records reviewed, including:      Past Surgical History:   No past surgical history on file.    Family History:     Family History   Problem Relation Age of Onset   . No known problems           Social History:     History   Smoking status   . Never Smoker    Smokeless tobacco   . Not on file     History   Alcohol Use: Not on file     History   Drug Use Not on file       Allergies:   No Known Allergies    Medications:     F707-F707.01 - MAR ACTION REPORT  (last 24 hrs)         EDELMAN, AMBER L, RN       Medication Name Action Time Action Site Route Rate Dose Reason Comments User     acyclovir (ZOVIRAX) 550 mg  in sodium chloride 0.9 % 250 mL IVPB 01/31/15 2671 New Bag  Intravenous 250 mL/hr 550 mg   Aubery Lapping, RN     chlorhexidine (PERIDEX) 0.12 % solution 15 mL 01/31/15 0457 Not Given  Mouth/Throat  15 mL Order parameters not met  Aubery Lapping, RN     levETIRAcetam (KEPPRA) IVPB 1,000 mg 100 mL (Or Linked Group #1) 01/31/15 0457 Given  Intravenous 400 mL/hr 1,000 mg   Aubery Lapping, RN          Donell Beers, RN       Medication Name Action Time Action Site Route Rate Dose Reason Comments User     0.9%  NaCl infusion 01/31/15 1230 New Bag  Intravenous 75 mL/hr    Laford, Christian, RN     acyclovir (ZOVIRAX) 550 mg in sodium chloride 0.9 % 250 mL IVPB 01/31/15 1333 New Bag  Intravenous 250 mL/hr 550 mg   Laford, Christian, RN     chlorhexidine (PERIDEX) 0.12 % solution 15 mL 01/31/15 1553 Given  Mouth/Throat  15 mL   Laford, Christian, RN     enoxaparin (LOVENOX) syringe 40 mg 01/31/15 2458 Given  Subcutaneous  40 mg   Laford, Christian, RN     levETIRAcetam  (KEPPRA) IVPB 1,000 mg 100 mL (Or Linked Group #1) 01/31/15 1553 Given  Intravenous 400 mL/hr 1,000 mg   Laford, Christian, RN     sennosides (SENOKOT) 8.8 MG/5ML oral syrup 17.6 mg 01/31/15 1000 Not Given  Oral  17.6 mg Other pt NPO Laford, Christian, RN     sennosides (SENOKOT) 8.8 MG/5ML oral syrup 17.6 mg 01/31/15 1825 Not Given  Oral  17.6 mg Other pt NPO Laford, Christian, RN          Harolyn Rutherford, RN       Medication Name Action Time Action Site Route Rate Dose Reason Comments User     acyclovir (ZOVIRAX) 550 mg in sodium chloride 0.9 % 250 mL IVPB 01/30/15 2258 New Bag  Intravenous 250 mL/hr 550 mg   Witham, Marlayna J, RN     sennosides (SENOKOT) 8.8 MG/5ML oral syrup 17.6 mg 01/30/15 2258 Not Given  Oral  17.6 mg Contraindicated  Witham, Adolm Joseph, RN                  Review of Systems:   As above in HPI.  Pt has delayed response, unable to provide detailed history.   Physical Exam:   Patient Vitals for the past 24 hrs:   BP Temp Temp src Pulse Resp SpO2   01/31/15 1800 140/82 mmHg - - 58 - 100 %   01/31/15 1700 141/80 mmHg - - 59 - 100 %   01/31/15 1600 112/66 mmHg 98.6 F (37 C) Oral 67 19 100 %   01/31/15 1500 109/63 mmHg - - 73 - 100 %   01/31/15 1400 112/65 mmHg - - 73 - 98 %   01/31/15 1300 119/75 mmHg - - 69 - 100 %   01/31/15 1200 116/70 mmHg 98.5 F (36.9 C) Oral 65 18 100 %   01/31/15 1100 114/70 mmHg - - 67 - 100 %   01/31/15 1000 116/66 mmHg - - 80 - 100 %   01/31/15 0900 114/67 mmHg - - 72 - 100 %   01/31/15 0800 106/52 mmHg 98.5 F (36.9 C) Oral 90 20 99 %   01/31/15 0700 105/59 mmHg - - 86 - 97 %   01/31/15  0600 113/68 mmHg - - 71 - 100 %   01/31/15 0500 113/67 mmHg - - 73 - 100 %   01/31/15 0400 114/66 mmHg 98.4 F (36.9 C) Oral 70 - 98 %   01/31/15 0300 122/82 mmHg - - 63 - 100 %   01/31/15 0200 107/66 mmHg - - 68 - 100 %   01/31/15 0100 101/64 mmHg - - 79 - 99 %   01/31/15 0000 109/55 mmHg 98.6 F (37 C) Oral 73 - 99 %   01/30/15 2300 111/69 mmHg - - 52 - 100 %   01/30/15 2200  115/68 mmHg - - 64 - 100 %   01/30/15 2100 125/79 mmHg - - 70 - 100 %   01/30/15 2000 121/76 mmHg 99.5 F (37.5 C) Oral 63 - 100 %   01/30/15 1900 119/71 mmHg - - 59 21 100 %     Body mass index is 24.41 kg/(m^2).    Intake/Output Summary (Last 24 hours) at 01/31/15 1820  Last data filed at 01/31/15 1800   Gross per 24 hour   Intake   1390 ml   Output   1455 ml   Net    -65 ml       General: awake, alert, delayed response, but follows commands,; no acute distress.  HEENT: perrla, eomi, sclera anicteric  oropharynx clear without lesions, mucous membranes moist  Neck: supple, no lymphadenopathy, no thyromegaly, no JVD, no carotid bruits  Cardiovascular: regular rate and rhythm, no murmurs, rubs or gallops  Lungs: clear to auscultation bilaterally, without wheezing, rhonchi, or rales  Abdomen: soft, non-tender, non-distended; no palpable masses, no hepatosplenomegaly, normoactive bowel sounds, no rebound or guarding  Extremities: no clubbing, cyanosis, or edema  Neuro: cranial nerves grossly intact, strength 3/5 in upper and lower extremities, sensation intact,   Skin: no rashes or lesions noted      I have personally reviewed the following labs and diagnostic studies.  Labs:     Results     Procedure Component Value Units Date/Time    Urine culture [[id number]] Collected:  01/30/15 0121    Specimen Information:  Bladder Urine Updated:  01/31/15 1257    Narrative:      ORDER#: [id number]                                    ORDERED BY: Nancie Neas  SOURCE: Urine                                        COLLECTED:  01/30/15 01:21  ANTIBIOTICS AT COLL.:                                RECEIVED :  01/30/15 14:39  Culture Urine                              FINAL       01/31/15 12:57  01/31/15   No growth of >1,000 CFU/ML, No further work      CULTURE + Dierdre Forth [[id number]] Collected:  01/30/15 0121    Specimen Information:  Sputum from Bronchial Brushing Updated:  01/31/15 4132    Narrative:       ORDER#:  [id number]                                    ORDERED BY: Nancie Neas  SOURCE: Bronchial Brushing et                        COLLECTED:  01/30/15 01:21  ANTIBIOTICS AT COLL.:                                RECEIVED :  01/30/15 04:38  Stain, Gram (Respiratory)                  FINAL       01/30/15 06:23  01/30/15   No Epithelial cells             Few WBC's             No organisms seen  Culture and Gram Stain, Aerobic, RespiratorPRELIM      01/31/15 09:26  01/31/15   Culture no growth to date. Final report to follow.      Cell MorpHology [[id number]]  (Abnormal) Collected:  01/31/15 0505     Cell Morphology: Abnormal (A) Updated:  01/31/15 0802     Macrocytic =1+ (A)      Platelet Estimate Normal     Manual Differential [[id number]]  (Abnormal) Collected:  01/31/15 0505     Segmented Neutrophils 48 % Updated:  01/31/15 0802     Band Neutrophils 0 %      Lymphocytes Manual 16 %      Monocytes Manual 11 %      Eosinophils Manual 1 %      Basophils Manual 1 %      Atypical Lymph % 23 %      Nucleated RBC 0 /100 WBC      Abs Seg Manual 4.22 x10 3/uL      Bands Absolute 0.00 x10 3/uL      Absolute Lymph Manual 1.40 x10 3/uL      Monocytes Absolute 0.99 x10 3/uL      Absolute Eos Manual 0.08 x10 3/uL      Absolute Baso Manual 0.08 x10 3/uL      Atypical Lymph Absolute 1.98 (H) x10 3/uL     CBC and differential [[id number]]  (Abnormal) Collected:  01/31/15 0505    Specimen Information:  Blood from Blood Updated:  01/31/15 0802     WBC 8.78 x10 3/uL      Hgb 11.4 (L) g/dL      Hematocrit 62.9 (L) %      Platelets 221 x10 3/uL      RBC 4.13 (L) x10 6/uL      MCV 83.3 fL      MCH 27.6 (L) pg      MCHC 33.1 g/dL      RDW 15 %      MPV 10.4 fL     Basic Metabolic Panel [[id number]]  (Abnormal) Collected:  01/31/15 0505    Specimen Information:  Blood Updated:  01/31/15 0635     Glucose 69 (L) mg/dL      BUN 9.0 mg/dL      Creatinine 0.7 mg/dL      Calcium 8.5 mg/dL      Sodium 010 mEq/L      Potassium 3.7 mEq/L  Chloride 107 mEq/L      CO2 18 (L) mEq/L     CULTURE BLOOD AEROBIC AND ANAEROBIC [[id number]] Collected:  01/30/15 0136    Specimen Information:  Blood, Venipuncture Updated:  01/31/15 0521    Narrative:      ORDER#: [id number]                                    ORDERED BY: Nancie Neas  SOURCE: Blood, Venipuncture peripheral               COLLECTED:  01/30/15 01:36  ANTIBIOTICS AT COLL.:                                RECEIVED :  01/30/15 04:37  Culture Blood Aerobic and Anaerobic        PRELIM      01/31/15 05:21  01/31/15   No Growth after 1 day/s of incubation.      CULTURE BLOOD AEROBIC AND ANAEROBIC [[id number]] Collected:  01/30/15 0121    Specimen Information:  Blood, Intravenous Line Updated:  01/31/15 0521    Narrative:      ORDER#: [id number]                                    ORDERED BY: Nancie Neas  SOURCE: Blood, Intravenous Line tlc                  COLLECTED:  01/30/15 01:21  ANTIBIOTICS AT COLL.:                                RECEIVED :  01/30/15 04:37  Culture Blood Aerobic and Anaerobic        PRELIM      01/31/15 05:21  01/31/15   No Growth after 1 day/s of incubation.            Imaging personally reviewed, including:     Mri Brain W Wo Contrast    01/29/2015  1. The temporal lobes are normal. 2. No subarachnoid space abnormality is identified. 3. No structural fluid collection, cerebritis, or other worrisome process. Trilby Drummer, MD 01/29/2015 10:54 AM     Xr Chest Ap Portable    01/30/2015   Mild left basal atelectasis improving from the prior study. No new or progressive airspace opacity or other acute abnormality. Annabell Sabal, MD 01/30/2015 10:38 AM     Xr Chest Ap Portable (single View) Once Et/ng/og Tube And Line Placement    01/28/2015    1.  The endotracheal tube tip is 3 cm above the carina. 2.  The enteric tube tip is at the gastric antrum. 3.  Minimal left basilar atelectasis. Demetrios Isaacs, MD 01/28/2015 8:56 AM         Safety Checklist  DVT prophylaxis:  CHEST guideline (See  page e199S) Chemical   Foley:  [place] Rn Foley protocol Not present   IVs:  Peripheral IV   PT/OT: Not needed   Daily CBC & or Chem ordered:  SHM/ABIM guidelines (see #5) No   Reference for approximate charges of common labs: CBC auto diff - $76  BMP - $99  Mg - $79    Signed  by: Gardiner Sleeper, MD   ZO:XWRU, Caryl Ada, MD

## 2015-01-31 NOTE — Progress Notes (Signed)
[facility] Neurology Consult Progress Note    Date Time: 01/31/2015   Patient Name: [name] [name],[name] [name]  Outpatient Neurologist: none    CC: headache, AMS    Assessment:   New onset seizures in a 19 y.o. AAF with no PMH who presents 01/27/15 with AMS followed by new onset seizures x3 in setting of 3-4 days of headache and vomiting. +Mono at urgent care. S/p intubation at OSH, extubated 12/18.    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 01/27/15, lab # [phone] (micro):    Clear, colorless   Tube 4: RBC 0, WBC 27 (2 seg, 70 lymph, 28 mono), protein 247.9, glucose 75   Bacterial cx/GS: no growth @ 48h reported as of 12/18, GS: few WBCs, no organisms seen   Pending: HSV PCR in process as of 12/18       Blood cx: no growth @ 48h as of 12/18        Most likely viral etiology based on CSF profile.         MRI brain wwo 12/17 was normal.    2. New onset seizures, 2/2 #1  Stable, on Keppra started at OSH.    3. Encephalopathy - post-ictal vs medication effect vs meningoencephalitis related.  Has not had seizures on cvEEG 12/16-12/18, but EEG is very slow even post extubation.    Recommendations:   - S/p extubation 12/18,   - Continue Keppra 1000mg  BID  - Called OSH lab this morning - CSF cx neg x 48h, HSV PCR still pending  - Continue acyclovir until HSV results return. Bacterial meningitis coverage has been d/c'd.  - If the encephalopathy does not clear as expected would consider pursuing further work-up/tx for noninfectious meningoencephalitis    Neurology Attending Addendum:    I have personally reviewed the interval history, images and pertinent test results and I have personally examined the patient and confirmed the major physical findings of the preceding NP/PA note.  Also, I have noted any changes since the note was written as well as added additional findings and recommendations.    [name] [name] is being evaluated at this time.  Basically with a  history of new onset seizure disorder.  .  She has been diagnosed to have aseptic meningoencephalitis.  Her clinical picture is improving.  .  She is evaluated with family present and they are comfortable with her progress.  She is more alert and able to participate in her care to a limited degree.  .  Current neuro therapy and management as outlined above.    Sinclair Ship, MD  [facility]  Neuro-hospitalist  [facility]  [employer] [phone]        Interval History/Subjective:   No acute neurological events reported or documented overnight.  Patient is awake, alert, answering questions and following commands appropriately.  Patient answering questions slowly and is performing all testing slowly but she is accurate.  Voice is weak. Patient did not pass her swallow study and currently remains NPO.  Mother is at the bedside.      Medications:     Current Facility-Administered Medications   Medication Dose Route Frequency   . acyclovir  10 mg/kg (Order-Specific) Intravenous Q8H   . chlorhexidine  15 mL Mouth/Throat Q12H   . enoxaparin  40 mg Subcutaneous Daily   . famotidine  20 mg Oral Q12H SCH    Or   . famotidine  20  mg Intravenous Q12H SCH   . levETIRAcetam  1,000 mg Intravenous Q12H   . senna  17.6 mg Oral BID       Review of Systems:   Neurological ROS negative except for that noted in the History/Subjective.    Physical Exam:   Temp:  [98.4 F (36.9 C)-100.1 F (37.8 C)] 98.5 F (36.9 C)  Heart Rate:  [52-90] 80  Resp Rate:  [19-22] 20  BP: (101-127)/(52-82) 116/66 mmHg    Vital Signs: Reviewed.    General: well developed, well nourished young woman.Laying in bed curled up in a tight ball.   CV: regular rate and rhythm  Resp: breathing comfortably on nasal cannula    Mental Status: eyes open to voice, tracks me but seems to regard her parents sitting to her right. Patient reporting her name and location.Speaking normally.  Follows commands.     Cranial nerves: PERRL. Gaze is  conjugate, no preference of deviation noted. [name] is symmetric.     Motor: Muscle tone normal. Withdraws briskly to pain x4, seems to be moving both sides equally.    Sensory: grimaces to light noxious stim x4    Reflexes: Reflexes were +1 throughout bilateral upper and lower extremities Toes downgoing bilaterally.     Coordination: FTN intact but slow.  No tremors    Gait: not safe to assess yet    Labs:     Results     Procedure Component Value Units Date/Time    CULTURE + Dierdre Forth [[id number]] Collected:  01/30/15 0121    Specimen Information:  Sputum from Bronchial Brushing Updated:  01/31/15 0926    Narrative:      ORDER#: [id number]                                    ORDERED BY: Nancie Neas  SOURCE: Bronchial Brushing et                        COLLECTED:  01/30/15 01:21  ANTIBIOTICS AT COLL.:                                RECEIVED :  01/30/15 04:38  Stain, Gram (Respiratory)                  FINAL       01/30/15 06:23  01/30/15   No Epithelial cells             Few WBC's             No organisms seen  Culture and Gram Stain, Aerobic, RespiratorPRELIM      01/31/15 09:26  01/31/15   Culture no growth to date. Final report to follow.      Cell MorpHology [[id number]]  (Abnormal) Collected:  01/31/15 0505     Cell Morphology: Abnormal (A) Updated:  01/31/15 0802     Macrocytic =1+ (A)      Platelet Estimate Normal     Manual Differential [[id number]]  (Abnormal) Collected:  01/31/15 0505     Segmented Neutrophils 48 % Updated:  01/31/15 0802     Band Neutrophils 0 %      Lymphocytes Manual 16 %      Monocytes Manual 11 %      Eosinophils Manual 1 %  Basophils Manual 1 %      Atypical Lymph % 23 %      Nucleated RBC 0 /100 WBC      Abs Seg Manual 4.22 x10 3/uL      Bands Absolute 0.00 x10 3/uL      Absolute Lymph Manual 1.40 x10 3/uL      Monocytes Absolute 0.99 x10 3/uL      Absolute Eos Manual 0.08 x10 3/uL      Absolute Baso Manual 0.08 x10 3/uL      Atypical Lymph Absolute 1.98 (H)  x10 3/uL     CBC and differential [[id number]]  (Abnormal) Collected:  01/31/15 0505    Specimen Information:  Blood from Blood Updated:  01/31/15 0802     WBC 8.78 x10 3/uL      Hgb 11.4 (L) g/dL      Hematocrit 40.9 (L) %      Platelets 221 x10 3/uL      RBC 4.13 (L) x10 6/uL      MCV 83.3 fL      MCH 27.6 (L) pg      MCHC 33.1 g/dL      RDW 15 %      MPV 10.4 fL     Basic Metabolic Panel [[id number]]  (Abnormal) Collected:  01/31/15 0505    Specimen Information:  Blood Updated:  01/31/15 0635     Glucose 69 (L) mg/dL      BUN 9.0 mg/dL      Creatinine 0.7 mg/dL      Calcium 8.5 mg/dL      Sodium 956 mEq/L      Potassium 3.7 mEq/L      Chloride 107 mEq/L      CO2 18 (L) mEq/L     CULTURE BLOOD AEROBIC AND ANAEROBIC [[id number]] Collected:  01/30/15 0136    Specimen Information:  Blood, Venipuncture Updated:  01/31/15 0521    Narrative:      ORDER#: [id number]                                    ORDERED BY: Nancie Neas  SOURCE: Blood, Venipuncture peripheral               COLLECTED:  01/30/15 01:36  ANTIBIOTICS AT COLL.:                                RECEIVED :  01/30/15 04:37  Culture Blood Aerobic and Anaerobic        PRELIM      01/31/15 05:21  01/31/15   No Growth after 1 day/s of incubation.      CULTURE BLOOD AEROBIC AND ANAEROBIC [[id number]] Collected:  01/30/15 0121    Specimen Information:  Blood, Intravenous Line Updated:  01/31/15 0521    Narrative:      ORDER#: [id number]                                    ORDERED BY: Nancie Neas  SOURCE: Blood, Intravenous Line tlc                  COLLECTED:  01/30/15 01:21  ANTIBIOTICS AT COLL.:  RECEIVED :  01/30/15 04:37  Culture Blood Aerobic and Anaerobic        PRELIM      01/31/15 05:21  01/31/15   No Growth after 1 day/s of incubation.            Rads:   Mri Brain W Wo Contrast    01/29/2015  1. The temporal lobes are normal. 2. No subarachnoid space abnormality is identified. 3. No structural fluid collection, cerebritis, or  other worrisome process. Trilby Drummer, MD 01/29/2015 10:54 AM     Xr Chest Ap Portable    01/30/2015   Mild left basal atelectasis improving from the prior study. No new or progressive airspace opacity or other acute abnormality. Annabell Sabal, MD 01/30/2015 10:38 AM     Xr Chest Ap Portable (single View) Once Et/ng/og Tube And Line Placement    01/28/2015    1.  The endotracheal tube tip is 3 cm above the carina. 2.  The enteric tube tip is at the gastric antrum. 3.  Minimal left basilar atelectasis. Demetrios Isaacs, MD 01/28/2015 8:56 AM         Signed by:  Norvel Richards. Clemon Chambers, FNP-BC  Nurse Practitioner  [facility]  Pager: [phone]  Philis Kendall [phone]  After Hours: [phone]    Please see attending Neurologist note that accompanies this mid-level encounter note.

## 2015-01-31 NOTE — Progress Notes (Signed)
The following test LTEM has been discontinued as per order. The EEG electrodes were removed with Mavidon and skin break down present due to pt pulled the leads off . If present the following areas were affected frontal and the assigned nurse was informed. The report will follow.

## 2015-01-31 NOTE — SLP Eval Note (Addendum)
[facility]   Speech and Language Therapy Bedside Swallow Evaluation     Patient: [name] [name]    MRN#: [id number]     Consult received for [name] [name] for SLP Bedside Swallow Evaluation and Treatment.    Plan/Recommendations:   Diet Solid Recommendations: NPO except ice chips (and sips ~1-3 small sips per hour with nursing only)   Liquid Recommendations:   n/a  Administration of Medications: NG tube  Precautions/Compensations: Awake/alert;Upright 90 degrees for all oral intake  Aspiration Precautions posted at bedside: n/a  Cognitive linguistic evaluation when appropriate. (added as addendum - JP)     Follow up treatments: pharyngeal exercises;diet tolerance monitoring;strategies training;Other (comment) (cognitive linguistic assessment)  Recommendation Discussed With: : Patient;Caregiver;Nurse       SLP Frequency Recommended: 5x per week    Discharge recommendations: Acute Rehab    Assessment:   Patient presents with mild to moderate oral and suspect pharyngeal dysphagia at this time.  Patient with delayed responses to all commands during this assessment.  Pt is aphonic with occasional whispered response. She does not answer personally relevant questions for SLP at this time.   No c/o throat pain.  Affect is flat.  Mastication is prolonged, with delayed swallow response. Laryngeal excursion upon palpation is decreased.  Multiple swallows palpated with sips of thin liquid by cup. No throat clear, no coughing, however severe dysphonia does indicate high risk of aspiration.  Recommend cognitive linguistic assessment including assess for apraxia.  Recommend NPO with occasional sips and chips with nursing only  until voicing is produced and swallow delay improves.  ENT f/u if not resolved over next day or two.     History of Present Illness:   [name] [name] is a 19 y.o. female admitted on 01/28/2015 without past medical history who presents to the hospital 01/28/15 with altered mental status and 3 new  onset witnessed seizures in setting of 3-4 days of headache and vomiting.   1. AMS, new onset seizures (GTC, status epilepticus)  CSF reported with WBC in the 20s and high protein, c/w aseptic meningoencephalitis.Most likely viral at this time. (from chart)    Chest xray:IMPRESSION:   Mild left basal atelectasis improving from the prior study.  No new or progressive airspace opacity or other acute abnormality.    [name] Sabal, MD   01/30/2015 10:38 AM    Medical Diagnosis: Altered mental status [R41.82]  Viral meningitis [A87.9]    Therapy Diagnosis:  Dysphonia, oropharyngeal dysphagia. R/o cognitive linguistic deficits.     Past Medical/Surgical History:  No past medical history on file.  No past surgical history on file.      History/Current Status:  Respiratory Status: room air  Date extubated: 01/30/15  Behavior/Mental Status: Lethargic;Requires prompting  Nutrition: NPO    Subjective:   Patient is agreeable to participation in the therapy session. Family and/or guardian are agreeable to patient's participation in the therapy session. Nursing clears patient for therapy. Patient's medical condition is appropriate for Speech therapy intervention at this time.    PAIN:  No c/o pain (nods head when asked)    Objective:   Observation of Patient/Vital Signs:  Patient is in bed with telemetry and SCD's in place.    Patient left with call bell within reach, all needs met, SCDs in place , fall mat in place, bed alarm activated and all questions answered. RN notified of session outcome and patient response.     Oral Motor Skills:  Event organiser  Oral  Motor Skills: exceptions to [facility]  Oral Motor Impairments: ROM;coordination;strength;dysphonia;other (comment) (cannot r/o apraxia)    Deglutition Skills:  Deglutition Skills  Position: upright 90 degrees  Food(s) Tested: ice chips;thin liquid;puree;solid  Oral Stage: chewing reduced, slow but effective;impaired swallow initiation  Pharyngeal Stage: delayed  response;reduced laryngeal elevation;multiple swallows per bolus    Goals:  Patient will tolerate trials of ice chips x 10 with no clinical s/s aspiration. NEW  Patient will tolerate 5/5  liquids with no clinical s/s aspiration.   Cognitive linguistic assessment within 48 hours if patient cooperative.     Lyndee Leo, MA, CCC-SLP    Pager [phone]      Time of Treatment:  SLP Received On: 01/31/15  Start Time: 0800  Stop Time: 0820  Time Calculation (min): 20 min

## 2015-02-01 ENCOUNTER — Encounter: Payer: Self-pay | Admitting: Internal Medicine

## 2015-02-01 LAB — CBC PATHOLOGIST REVIEW

## 2015-02-01 LAB — CBC
Hematocrit: 33.9 % — ABNORMAL LOW (ref 37.0–47.0)
Hgb: 11.5 g/dL — ABNORMAL LOW (ref 12.0–16.0)
MCH: 28.1 pg (ref 28.0–32.0)
MCHC: 33.9 g/dL (ref 32.0–36.0)
MCV: 82.9 fL (ref 80.0–100.0)
MPV: 10.6 fL (ref 9.4–12.3)
Nucleated RBC: 1 /100 WBC (ref 0–1)
Platelets: 257 10*3/uL (ref 140–400)
RBC: 4.09 10*6/uL — ABNORMAL LOW (ref 4.20–5.40)
RDW: 15 % (ref 12–15)
WBC: 7.81 10*3/uL (ref 3.50–10.80)

## 2015-02-01 LAB — BASIC METABOLIC PANEL
BUN: 8 mg/dL (ref 7.0–19.0)
CO2: 16 mEq/L — ABNORMAL LOW (ref 22–29)
Calcium: 8.5 mg/dL (ref 8.5–10.5)
Chloride: 107 mEq/L (ref 100–111)
Creatinine: 0.8 mg/dL (ref 0.6–1.0)
Glucose: 68 mg/dL — ABNORMAL LOW (ref 70–100)
Potassium: 3.8 mEq/L (ref 3.5–5.1)
Sodium: 138 mEq/L (ref 136–145)

## 2015-02-01 NOTE — Plan of Care (Signed)
Problem: Neurological Deficit  Goal: Neurological status is stable or improving  Outcome: Progressing  Pt oriented x 4 with quiet speech and delayed answers. Generalized weakness. Plan for pt to receive PT/OT and SLP tomorrow. Will continue to monitor.

## 2015-02-01 NOTE — Plan of Care (Signed)
Problem: Neurological Deficit  Goal: Neurological status is stable or improving  Outcome: Progressing  Pt continues to be difficult to assess neurologically as the patient is uncooperative when asking questions or asked to perform motor tasks. When the pt decides to participate in the assessment she exhibits proper orientation and motor function 4/5 in all extremities. Pt is otherwise neurologically intact with minimal delay in response.

## 2015-02-01 NOTE — SLP Progress Note (Signed)
[facility]   SLP Treatment Note  Patient: [name] [name]    MRN#: [id number]     Treatment Type: dysphagia f/u    Recommendations/Plan:   - Diet Recommendations: Full liquid diet; no liquid consistency restrictions   - Aspiration Precautions: Sitting upright, slow intake rate, close supervision  - d/c diet with any s/s aspiration (cough, throat clearing, watery eyes, change in respiratory rate/drop in SpO2) and contact SLP  - consider ENT consult if vocal quality does not improve    SLP Frequency Recommended: 3x per week    Discharge recommendations: Rehab consult    Assessment:   Patient presents with evidence oropharyngeal dysphagia. Patient with reduced initiation and prolonged oral transit. One-two swallows per bolus with adequate hyolaryngeal movement and no change in vital signs. Unable to assess vocal quality due to breathy, weak voice. ?Vocal fold dysfunction, but RN and pt's mother report they have heard pt vocalize with greater intensity. Although pharyngeal swallow function appears [facility] for oral intake, cannot r/o silent aspiration. SLP recommends initiating full liquid diet and f/u closely at bedside. Contact SLP if any s/s aspiration are observed at bedside. Consider ENT consult if vocal quality does not improve.     Subjective:   Pain: Denies  Current Diet: NPO   Respiratory Status: Room air   Precautions: Fall  RN clears SLP for tx. [name] present. Patient left with call bell within reach, all needs met, SCDs in place, fall mat in place, bed alarm activated and all questions answered. RN notified of session outcome and patient response.   Objective:   Objective:   Pt accepted ice chips, thin liquids by cup/straw, puree. Reduced oral ROM and prolonged oral transit. Adequate hyolaryngeal movement upon palpation. 1-2 swallows per bolus. No s/s aspiration. VSS. Unable to assess vocal quality due to aphonia.     Patient Education: Verbal    Goals:   Patient will tolerate full liquid diet  without s/s aspiration x24-48 hours. new     Matt Meko Bellanger MS, CCC-SLP     Time of Treatment:  SLP Received On: 02/01/15  Start Time: 1030  Stop Time: 1045  Time Calculation (min): 15 min

## 2015-02-01 NOTE — Progress Notes (Addendum)
[facility] Neurology Consult Progress Note    Date Time: 02/01/2015   Patient Name: [name] [name],[name] [name]  Outpatient Neurologist: none    CC: headache, AMS    Assessment:   New onset seizures in a 19 y.o. AAF with no PMH who presents 01/27/15 with AMS followed by new onset seizures x3 in setting of 3-4 days of headache and vomiting. +Mono at urgent care. S/p intubation at OSH, extubated 12/18.    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 01/27/15, lab # [phone] (micro):    Clear, colorless   Tube 4: RBC 0, WBC 27 (2 seg, 70 lymph, 28 mono), protein 247.9, glucose 75   Bacterial cx/GS: no growth @ 48h reported as of 12/18, GS: few WBCs, no organisms seen   Pending: HSV PCR in process as of 12/18       Blood cx: no growth @ 48h as of 12/18        Most likely viral etiology based on CSF profile.         MRI brain wwo 12/17 was normal.    2. New onset seizures, 2/2 #1  Stable, on Keppra started at OSH.    3. Encephalopathy - post-ictal vs medication effect vs meningoencephalitis related.  Has not had seizures on cvEEG 12/16-12/18, but EEG is very slow even post extubation.    Recommendations:   - Continue Keppra 1000mg  BID  - Continue acyclovir until HSV results return. Bacterial meningitis coverage has been d/c'd.  - Continue medical management by primary team  - Supportive therapies: PT/OT/SLP    Neurology Attending Addendum:    I have personally reviewed the interval history, images and pertinent test results and I have personally examined the patient and confirmed the major physical findings of the preceding NP/PA note.  Also, I have noted any changes since the note was written as well as added additional findings and recommendations.    [name] [name] is evaluated with her mother present.  .  She is more lethargic and not into her assessment at this time.  .  She does alert and responding to her mother appropriately.  .  Current management to be  continued.  Awaiting further diagnostic study results, namely from her spinal fluid.    [name] Ship, MD  [facility]  Neuro-hospitalist  [employer]  [employer] [phone]        Interval History/Subjective:   No acute neurological events reported or documented overnight.  Patient is awake, alert, answering questions and following commands appropriately.  Patient answering questions slowly with soft speech. She continues performing all testing slowly but she is accurate.  Voice is weak. Patient did not pass her swallow study and currently remains NPO.  Patient has been downgraded to [facility] status.    cvEEG  12/18 to 12/19  Impression: This is an abnormal continuous video EEG with moderate generalized slowing of the background consistent with a nonspecific encephalopathy. There are no epileptiform discharges and no seizures. No behavioral events are noted. Clinical correlation is recommended.  Medications:     Current Facility-Administered Medications   Medication Dose Route Frequency   . acyclovir  10 mg/kg (Order-Specific) Intravenous Q8H   . chlorhexidine  15 mL Mouth/Throat Q12H   . enoxaparin  40 mg Subcutaneous Daily   . levETIRAcetam  1,000 mg Intravenous Q12H   . senna  17.6 mg Oral BID  Review of Systems:   Neurological ROS negative except for that noted in the History/Subjective.    Physical Exam:   Temp:  [98.5 F (36.9 C)-99 F (37.2 C)] 98.6 F (37 C)  Heart Rate:  [49-84] 70  Resp Rate:  [18-19] 19  BP: (107-141)/(62-89) 113/62 mmHg    Vital Signs: Reviewed.    General: well developed, well nourished young woman.  CV: regular rate and rhythm  Resp: breathing comfortably on nasal cannula    Mental Status: eyes open to voice, tracks me but seems to regard her parents sitting to her right. Patient reporting her name and location.  Speaking normally.  Follows commands.     Cranial nerves: PERRL. Gaze is conjugate, no preference of deviation noted. [name] is symmetric.      Motor: Muscle tone normal. Withdraws briskly to pain x4, seems to be moving both sides equally.  Muscle strength seems to be 3+/5 throughout bilateral upper and lower extremities.    Sensory: grimaces to light noxious stim x4    Reflexes: Reflexes were +1 throughout bilateral upper and lower extremities Toes downgoing bilaterally.     Coordination: FTN intact but slow.  No tremors    Gait: Deferred due to concern for patient safety    Labs:     Results     Procedure Component Value Units Date/Time    CULTURE + Dierdre Forth [[id number]] Collected:  01/30/15 0121    Specimen Information:  Sputum from Bronchial Brushing Updated:  02/01/15 0842    Narrative:      ORDER#: [id number]                                    ORDERED BY: Nancie Neas  SOURCE: Bronchial Brushing et                        COLLECTED:  01/30/15 01:21  ANTIBIOTICS AT COLL.:                                RECEIVED :  01/30/15 04:38  Stain, Gram (Respiratory)                  FINAL       01/30/15 06:23  01/30/15   No Epithelial cells             Few WBC's             No organisms seen  Culture and Gram Stain, Aerobic, RespiratorFINAL       02/01/15 08:42  02/01/15   No growth      Basic Metabolic Panel [[id number]]  (Abnormal) Collected:  02/01/15 0507    Specimen Information:  Blood Updated:  02/01/15 0626     Glucose 68 (L) mg/dL      BUN 8.0 mg/dL      Creatinine 0.8 mg/dL      Calcium 8.5 mg/dL      Sodium 086 mEq/L      Potassium 3.8 mEq/L      Chloride 107 mEq/L      CO2 16 (L) mEq/L     CBC without differential [[id number]]  (Abnormal) Collected:  02/01/15 0507    Specimen Information:  Blood from Blood Updated:  02/01/15 0543     WBC 7.81 x10 3/uL      Hgb  11.5 (L) g/dL      Hematocrit 40.9 (L) %      Platelets 257 x10 3/uL      RBC 4.09 (L) x10 6/uL      MCV 82.9 fL      MCH 28.1 pg      MCHC 33.9 g/dL      RDW 15 %      MPV 10.6 fL      Nucleated RBC 1 /100 WBC     CULTURE BLOOD AEROBIC AND ANAEROBIC [[id number]] Collected:   01/30/15 0136    Specimen Information:  Blood, Venipuncture Updated:  02/01/15 0521    Narrative:      ORDER#: [id number]                                    ORDERED BY: Nancie Neas  SOURCE: Blood, Venipuncture peripheral               COLLECTED:  01/30/15 01:36  ANTIBIOTICS AT COLL.:                                RECEIVED :  01/30/15 04:37  Culture Blood Aerobic and Anaerobic        PRELIM      02/01/15 05:21  01/31/15   No Growth after 1 day/s of incubation.  02/01/15   No Growth after 2 day/s of incubation.      CULTURE BLOOD AEROBIC AND ANAEROBIC [[id number]] Collected:  01/30/15 0121    Specimen Information:  Blood, Intravenous Line Updated:  02/01/15 0521    Narrative:      ORDER#: [id number]                                    ORDERED BY: Nancie Neas  SOURCE: Blood, Intravenous Line tlc                  COLLECTED:  01/30/15 01:21  ANTIBIOTICS AT COLL.:                                RECEIVED :  01/30/15 04:37  Culture Blood Aerobic and Anaerobic        PRELIM      02/01/15 05:21  01/31/15   No Growth after 1 day/s of incubation.  02/01/15   No Growth after 2 day/s of incubation.      Urine culture [[id number]] Collected:  01/30/15 0121    Specimen Information:  Bladder Urine Updated:  01/31/15 1257    Narrative:      ORDER#: [id number]                                    ORDERED BY: Nancie Neas  SOURCE: Urine                                        COLLECTED:  01/30/15 01:21  ANTIBIOTICS AT COLL.:  RECEIVED :  01/30/15 14:39  Culture Urine                              FINAL       01/31/15 12:57  01/31/15   No growth of >1,000 CFU/ML, No further work            Rads:   Mri Brain W Wo Contrast    01/29/2015  1. The temporal lobes are normal. 2. No subarachnoid space abnormality is identified. 3. No structural fluid collection, cerebritis, or other worrisome process. Trilby Drummer, MD 01/29/2015 10:54 AM     Xr Chest Ap Portable    01/30/2015   Mild left basal atelectasis  improving from the prior study. No new or progressive airspace opacity or other acute abnormality. Annabell Sabal, MD 01/30/2015 10:38 AM     Xr Chest Ap Portable (single View) Once Et/ng/og Tube And Line Placement    01/28/2015    1.  The endotracheal tube tip is 3 cm above the carina. 2.  The enteric tube tip is at the gastric antrum. 3.  Minimal left basilar atelectasis. Demetrios Isaacs, MD 01/28/2015 8:56 AM         Signed by:  Norvel Richards. Clemon Chambers, FNP-BC  Nurse Practitioner   [facility] Neurology  Pager: [phone]  Spectralink [phone]  After Hours: [phone]    Please see attending Neurologist note that accompanies this mid-level encounter note.

## 2015-02-01 NOTE — SLP Eval Note (Signed)
[facility]   Speech and Language Therapy Evaluation     Patient: [name] [name]    MRN#: [id number]     Consult received for Darlin Priestly for SLP Evaluation and Treatment.    Assessment:     Clinical Impression: [name] [name] is a 19 y.o. female admitted 01/28/2015 for Altered mental status [R41.82]  Viral meningitis [A87.9] presenting with cognitive-linguistic dysfunction. Patient with impaired affect, initiation and responsive speech resulting in limited assessment. Patient able to follow directions and answer Y/N questions. Limited one word responses after significant prompting. Patient would benefit from extended cognitive assessment once she is able to fully participate. Consider psych consult. Patient also with weak vocal quality and may benefit from ENT consult if this does not improve in the next day or two.    Therapy Diagnosis:cognitive-linguistic dysfunction    Discharge Recommendations:   Expected disposition: Recommendations: Rehab consult; psych consult    Plan:   Plan:     SLP Frequency Recommended: 3x per week     Current Hospitalization:  Referring Physician: Dr. Jearld Shines  Date of Referral: 01/30/15    Medical Diagnosis: Altered mental status [R41.82]  Viral meningitis [A87.9]    History of Present Illness: [name] [name] is a 19 y.o. female admitted on 01/28/2015 with   Patient Active Problem List   Diagnosis   . Meningitis   . Seizure   . Mononucleosis       Past Medical/Surgical History:  No past medical history on file. No past surgical history on file.      Social History:  Prior Level of Function  Prior level of function: Independent with ADLs  Home Living Arrangements  Living Arrangements: Family members, Parent  Home Layout: Two level  Subjective:   Patient is agreeable to participation in the therapy session. Family and/or guardian are agreeable to patient's participation in the therapy session. Nursing clears patient for therapy. Patient's medical condition is appropriate for  Speech therapy intervention at this time.     PAIN: Denies  Objective:   Observation of Patient/Vital Signs:  Patient is in bed with dressings, telemetry and SCD's in place.    Patient left with call bell within reach, all needs met, SCDs in place, fall mat in place, bed alarm activated and all questions answered. RN notified of session outcome and patient response.     Cognitive Status and Neuro Exam:   A&O to self, month, place  Refused to answer most questions; appeared to be ignoring clinician    Oral Motor Assessment:   Reduced mandibular opening. Aphonic vocal quality; RN and pt's mother report pt previously spoke louder    Auditory Comprehension:   Able to follow single step commands and answer Y/N questions with 100% accuracy.    Reading Comprehension:   UTA    Expression:   Verbal    Verbal Expression:   Spoke in appropriate one word utterances when prompted significantly    Written Expression:   UTA    Pragmatics:   Flat affect; reduced initiation and conversational turn-taking; required significant prompting to speak; reduced eye contact    Behavior:   Withdrawn; ignored clinician and started to play with fingernails towards the end of the session    Educated the patient to role of speech therapy, plan of care, goals of therapy and recommendations.      Goals: Goals to be achieved in 5 sessions:    Patient will speak with adequate vocal intensity for intelligible speech.  Patient  will participate in conversational speech with clinician.  Patient will complete further cognitive testing.     Matt Kalila Adkison MS, CCC-SLP     Time of treatment:   SLP Received On: 02/01/15  Start Time: 1015  Stop Time: 1030  Time Calculation (min): 15 min

## 2015-02-01 NOTE — Progress Notes (Signed)
MEDICINE PROGRESS NOTE    Date Time: 02/01/2015 1:53 PM  Patient Name: [name],[name]  Attending Physician: Caesar Bookman, MD        Subjective     At first she would not interact with me but her mother got her up in the bed and she was A and O x 3 and was able to point to why she was in the hospital.       Blood pressure 102/58, pulse 56, temperature 98.1 F (36.7 C), temperature source Oral, resp. rate 18, height 1.6 m (5' 2.99"), weight 62.5 kg (137 lb 12.6 oz), SpO2 100 %.    ROS   A 5 point review of symptoms was preformed and was otherwise negative.     Physical Exam   Constitutional: She is oriented to person, place, and time. She appears well-developed and well-nourished. No distress.   Cardiovascular: Normal rate, regular rhythm and normal heart sounds.  Exam reveals no gallop and no friction rub.    No murmur heard.  Pulmonary/Chest: Effort normal and breath sounds normal. No respiratory distress. She has no wheezes. She has no rales. She exhibits no tenderness.   Abdominal: Soft. Bowel sounds are normal. She exhibits no distension and no mass. There is no tenderness. There is no rebound and no guarding.   Neurological: She is alert and oriented to person, place, and time.   spontaneously moving all extremities.   Skin: She is not diaphoretic.       Meds:     Medications were reviewed:    Labs:     Results     Procedure Component Value Units Date/Time    CBC Pathologist Review [[id number]] Collected:  01/31/15 0505     CBC Pathologist Review See Note Updated:  02/01/15 1209    CULTURE + Dierdre Forth [[id number]] Collected:  01/30/15 0121    Specimen Information:  Sputum from Bronchial Brushing Updated:  02/01/15 0842    Narrative:      ORDER#: [id number]                                    ORDERED BY: Nancie Neas  SOURCE: Bronchial Brushing et                        COLLECTED:  01/30/15 01:21  ANTIBIOTICS AT COLL.:                                RECEIVED :  01/30/15 04:38  Stain,  Gram (Respiratory)                  FINAL       01/30/15 06:23  01/30/15   No Epithelial cells             Few WBC's             No organisms seen  Culture and Gram Stain, Aerobic, RespiratorFINAL       02/01/15 08:42  02/01/15   No growth      Basic Metabolic Panel [[id number]]  (Abnormal) Collected:  02/01/15 0507    Specimen Information:  Blood Updated:  02/01/15 0626     Glucose 68 (L) mg/dL      BUN 8.0 mg/dL      Creatinine 0.8 mg/dL      Calcium 8.5  mg/dL      Sodium 259 mEq/L      Potassium 3.8 mEq/L      Chloride 107 mEq/L      CO2 16 (L) mEq/L     CBC without differential [[id number]]  (Abnormal) Collected:  02/01/15 0507    Specimen Information:  Blood from Blood Updated:  02/01/15 0543     WBC 7.81 x10 3/uL      Hgb 11.5 (L) g/dL      Hematocrit 32.9 (L) %      Platelets 257 x10 3/uL      RBC 4.09 (L) x10 6/uL      MCV 82.9 fL      MCH 28.1 pg      MCHC 33.9 g/dL      RDW 15 %      MPV 10.6 fL      Nucleated RBC 1 /100 WBC     CULTURE BLOOD AEROBIC AND ANAEROBIC [[id number]] Collected:  01/30/15 0136    Specimen Information:  Blood, Venipuncture Updated:  02/01/15 0521    Narrative:      ORDER#: [id number]                                    ORDERED BY: Nancie Neas  SOURCE: Blood, Venipuncture peripheral               COLLECTED:  01/30/15 01:36  ANTIBIOTICS AT COLL.:                                RECEIVED :  01/30/15 04:37  Culture Blood Aerobic and Anaerobic        PRELIM      02/01/15 05:21  01/31/15   No Growth after 1 day/s of incubation.  02/01/15   No Growth after 2 day/s of incubation.      CULTURE BLOOD AEROBIC AND ANAEROBIC [[id number]] Collected:  01/30/15 0121    Specimen Information:  Blood, Intravenous Line Updated:  02/01/15 0521    Narrative:      ORDER#: [id number]                                    ORDERED BY: Nancie Neas  SOURCE: Blood, Intravenous Line tlc                  COLLECTED:  01/30/15 01:21  ANTIBIOTICS AT COLL.:                                RECEIVED :  01/30/15  04:37  Culture Blood Aerobic and Anaerobic        PRELIM      02/01/15 05:21  01/31/15   No Growth after 1 day/s of incubation.  02/01/15   No Growth after 2 day/s of incubation.            Microbiology, reviewed and are significant for:  Microbiology Results     Procedure Component Value Units Date/Time    CULTURE + Dierdre Forth [[id number]] Collected:  01/30/15 0121    Specimen Information:  Sputum from Bronchial Brushing Updated:  02/01/15 0842    Narrative:      ORDER#: [id number]  ORDERED BY: ALTAWEEL, LAITH  SOURCE: Bronchial Brushing et                        COLLECTED:  01/30/15 01:21  ANTIBIOTICS AT COLL.:                                RECEIVED :  01/30/15 04:38  Stain, Gram (Respiratory)                  FINAL       01/30/15 06:23  01/30/15   No Epithelial cells             Few WBC's             No organisms seen  Culture and Gram Stain, Aerobic, RespiratorFINAL       02/01/15 08:42  02/01/15   No growth      CULTURE BLOOD AEROBIC AND ANAEROBIC [[id number]] Collected:  01/30/15 0136    Specimen Information:  Blood, Venipuncture Updated:  02/01/15 0521    Narrative:      ORDER#: [id number]                                    ORDERED BY: Nancie Neas  SOURCE: Blood, Venipuncture peripheral               COLLECTED:  01/30/15 01:36  ANTIBIOTICS AT COLL.:                                RECEIVED :  01/30/15 04:37  Culture Blood Aerobic and Anaerobic        PRELIM      02/01/15 05:21  01/31/15   No Growth after 1 day/s of incubation.  02/01/15   No Growth after 2 day/s of incubation.      CULTURE BLOOD AEROBIC AND ANAEROBIC [[id number]] Collected:  01/30/15 0121    Specimen Information:  Blood, Intravenous Line Updated:  02/01/15 0521    Narrative:      ORDER#: [id number]                                    ORDERED BY: Nancie Neas  SOURCE: Blood, Intravenous Line tlc                  COLLECTED:  01/30/15 01:21  ANTIBIOTICS AT COLL.:                                 RECEIVED :  01/30/15 04:37  Culture Blood Aerobic and Anaerobic        PRELIM      02/01/15 05:21  01/31/15   No Growth after 1 day/s of incubation.  02/01/15   No Growth after 2 day/s of incubation.      MRSA culture [[id number]] Collected:  01/28/15 0317    Specimen Information:  Body Fluid from Nasal/Throat ASC Admission Updated:  01/29/15 0840    Narrative:      ORDER#: [id number]  ORDERED BY: MALIK, OSMAN  SOURCE: Nares and Throat                             COLLECTED:  01/28/15 03:17  ANTIBIOTICS AT COLL.:                                RECEIVED :  01/28/15 06:40  Culture MRSA Surveillance                  FINAL       01/29/15 08:40  01/29/15   Negative for Methicillin Resistant Staph aureus from Nares and             Negative for Methicillin Resistant Staph aureus from Throat      Urine culture [[id number]] Collected:  01/30/15 0121    Specimen Information:  Bladder Urine Updated:  01/31/15 1257    Narrative:      ORDER#: [id number]                                    ORDERED BY: Nancie Neas  SOURCE: Urine                                        COLLECTED:  01/30/15 01:21  ANTIBIOTICS AT COLL.:                                RECEIVED :  01/30/15 14:39  Culture Urine                              FINAL       01/31/15 12:57  01/31/15   No growth of >1,000 CFU/ML, No further work              Imaging, reviewed and are significant for:  Microbiology Results     Procedure Component Value Units Date/Time    CULTURE + Dierdre Forth [[id number]] Collected:  01/30/15 0121    Specimen Information:  Sputum from Bronchial Brushing Updated:  02/01/15 0842    Narrative:      ORDER#: [id number]                                    ORDERED BY: Nancie Neas  SOURCE: Bronchial Brushing et                        COLLECTED:  01/30/15 01:21  ANTIBIOTICS AT COLL.:                                RECEIVED :  01/30/15 04:38  Stain, Gram (Respiratory)                  FINAL        01/30/15 06:23  01/30/15   No Epithelial cells             Few WBC's  No organisms seen  Culture and Gram Stain, Aerobic, RespiratorFINAL       02/01/15 08:42  02/01/15   No growth      CULTURE BLOOD AEROBIC AND ANAEROBIC [[id number]] Collected:  01/30/15 0136    Specimen Information:  Blood, Venipuncture Updated:  02/01/15 0521    Narrative:      ORDER#: [id number]                                    ORDERED BY: Nancie Neas  SOURCE: Blood, Venipuncture peripheral               COLLECTED:  01/30/15 01:36  ANTIBIOTICS AT COLL.:                                RECEIVED :  01/30/15 04:37  Culture Blood Aerobic and Anaerobic        PRELIM      02/01/15 05:21  01/31/15   No Growth after 1 day/s of incubation.  02/01/15   No Growth after 2 day/s of incubation.      CULTURE BLOOD AEROBIC AND ANAEROBIC [[id number]] Collected:  01/30/15 0121    Specimen Information:  Blood, Intravenous Line Updated:  02/01/15 0521    Narrative:      ORDER#: [id number]                                    ORDERED BY: Nancie Neas  SOURCE: Blood, Intravenous Line tlc                  COLLECTED:  01/30/15 01:21  ANTIBIOTICS AT COLL.:                                RECEIVED :  01/30/15 04:37  Culture Blood Aerobic and Anaerobic        PRELIM      02/01/15 05:21  01/31/15   No Growth after 1 day/s of incubation.  02/01/15   No Growth after 2 day/s of incubation.      MRSA culture [[id number]] Collected:  01/28/15 0317    Specimen Information:  Body Fluid from Nasal/Throat ASC Admission Updated:  01/29/15 0840    Narrative:      ORDER#: [id number]                                    ORDERED BY: Herminio Heads  SOURCE: Nares and Throat                             COLLECTED:  01/28/15 03:17  ANTIBIOTICS AT COLL.:                                RECEIVED :  01/28/15 06:40  Culture MRSA Surveillance                  FINAL       01/29/15 08:40  01/29/15   Negative for Methicillin Resistant Staph aureus from Nares and  Negative for  Methicillin Resistant Staph aureus from Throat      Urine culture [[id number]] Collected:  01/30/15 0121    Specimen Information:  Bladder Urine Updated:  01/31/15 1257    Narrative:      ORDER#: [id number]                                    ORDERED BY: Nancie Neas  SOURCE: Urine                                        COLLECTED:  01/30/15 01:21  ANTIBIOTICS AT COLL.:                                RECEIVED :  01/30/15 14:39  Culture Urine                              FINAL       01/31/15 12:57  01/31/15   No growth of >1,000 CFU/ML, No further work          Assessment:   [name] [name] is a 19 y.o. female w/o significant PMH who initially presented w/ headache, N/V. Pt was diagnosed w/ mono but presented w/ AMS. Pt was found to have meningitis. Pt had witnessed seizure x 3 in ER. Pt was started on keppra, acyclovir, intubated for airway protection, transferred from OSH to NSICU on 01/28/15. Pt was seen by neurology, extubated yesterday. So far EEG has been showing encephalopathy but no seizure. Pt is now stable for transfer to [facility]. For Pt's ICU course, please refer to the following log.    12/17: cEEG placed yesterday in am. Had MRI. On PRVC, no weaning. On Propofol and Fentanyl gtt. Shivering off sedation. LGF. SBP stable. Foley reinserted due to urinary retention. Minimal secretions. No diarrhea, no rashes.    12/18: No seizures, encephalopathic tracing on cEEG. Off sedation in am. Failed CPAP in pm due to agitation and vomiting. On PRVC o/n. Agitated with am CPAP trial. Febrile to 101.1C. SBP stable.     12/19: Extubated at 10AM yesterday, following commands last night, at 0400 started talking, delayed response. Speech eval this AM, remain NPO. No fevers.     Principal Problem:   Meningitis  Active Problems:   Seizure   Mononucleosis    19 yo f w/o significant PMH p/w headache, N/V, diagnosed w/ mono, p/w AMS, found to have meningitis, witnessed seizure x 3 in ER, started on keppra, acyclovir,  intubated for airway protection, transferred from OSH to NSICU on 01/28/15, seen by neurology, extubated 12/18, no seizure on EEG, now stable for transfer to [facility].   Plan:   Transfer to [facility].  Transfer orders per [facility].  Neurology consult.  Continue current hospital meds: keppra, acyclovir.  SLP consult.  Lovenox for DVT prophylaxis    Aseptic meningoencephalitis   - appreciate neurology consultation: "presented with AMS, new onset seizures (GTC, status epilepticus)CSF [facility] 01/27/15, lab # [phone] (micro): Clear, colorlessTube 4: RBC 0, WBC 27 (2 seg, 70 lymph, 28 mono), protein 247.9, glucose 75Bacterial cx/GS: no growth @ 48h reported as of 12/18, GS: few WBCs, no organisms seenPending: HSV PCR in process as of 12/18  Blood cx: no growth @ 48h as of 12/18 Most likely viral etiology based on CSF profile. MRI brain wwo 12/17 was normal."  - continue Keppra 1000mg  BID  - continue acyclovir pending HSV results.   - PT/OT and speech    Seizures with Encephalopathy  Continue  Keppra   - appreciate neurology recommendations: "Encephalopathy - post-ictal vs medication effect vs meningoencephalitis related. Has not had seizures on cvEEG 12/16-12/18, but EEG is very slow even post extubation"    Lovenox for DVT prophylaxis    Calorie count    Signed by: Caesar Bookman, MD          Latanya Presser Lennie Vasco  02/01/2015

## 2015-02-01 NOTE — Plan of Care (Signed)
Problem: Safety  Goal: Patient will be free from injury during hospitalization  Outcome: Progressing  Purposeful hourly rounding was performed and pt remain free from falls for duration of shift through use of bed alarms and use of low bed. Pt was educated as to need to remain on bedrest and hourly toileting opportunities were offered to ensure patients comfort and safety.

## 2015-02-02 DIAGNOSIS — Z029 Encounter for administrative examinations, unspecified: Secondary | ICD-10-CM

## 2015-02-02 LAB — CBC
Hematocrit: 32.2 % — ABNORMAL LOW (ref 37.0–47.0)
Hgb: 11.2 g/dL — ABNORMAL LOW (ref 12.0–16.0)
MCH: 28.6 pg (ref 28.0–32.0)
MCHC: 34.8 g/dL (ref 32.0–36.0)
MCV: 82.1 fL (ref 80.0–100.0)
MPV: 10.6 fL (ref 9.4–12.3)
Nucleated RBC: 0 /100 WBC (ref 0–1)
Platelets: 257 10*3/uL (ref 140–400)
RBC: 3.92 10*6/uL — ABNORMAL LOW (ref 4.20–5.40)
RDW: 15 % (ref 12–15)
WBC: 10.41 10*3/uL (ref 3.50–10.80)

## 2015-02-02 LAB — BASIC METABOLIC PANEL
BUN: 3 mg/dL — ABNORMAL LOW (ref 7.0–19.0)
CO2: 21 mEq/L — ABNORMAL LOW (ref 22–29)
Calcium: 8.2 mg/dL — ABNORMAL LOW (ref 8.5–10.5)
Chloride: 107 mEq/L (ref 100–111)
Creatinine: 0.7 mg/dL (ref 0.6–1.0)
Glucose: 102 mg/dL — ABNORMAL HIGH (ref 70–100)
Potassium: 3.3 mEq/L — ABNORMAL LOW (ref 3.5–5.1)
Sodium: 139 mEq/L (ref 136–145)

## 2015-02-02 NOTE — Plan of Care (Signed)
Problem: Safety  Goal: Patient will be free from injury during hospitalization  Outcome: Progressing  Patient educated on how and when to use the call bell.  Not trying to get out of bed.  Mom at the bedside to assist with anything if needed.

## 2015-02-02 NOTE — Progress Notes (Addendum)
MEDICINE PROGRESS NOTE    Date Time: 02/02/2015 5:38 PM  Patient Name: [name] [name],[name] [name]  Attending Physician: Caesar Bookman, MD        Subjective     She is doing much better. She is more interactive and conversant today.       Blood pressure 114/69, pulse 62, temperature 98.6 F (37 C), temperature source Oral, resp. rate 18, height 1.6 m (5' 2.99"), weight 60 kg (132 lb 4.4 oz), SpO2 100 %.    ROS   A 5 point review of symptoms was preformed and was otherwise negative.     Physical Exam   Constitutional: She is oriented to person, place, and time. She appears well-developed and well-nourished. No distress.   Cardiovascular: Normal rate, regular rhythm and normal heart sounds.  Exam reveals no gallop and no friction rub.    No murmur heard.  Pulmonary/Chest: Effort normal and breath sounds normal. No respiratory distress. She has no wheezes. She has no rales. She exhibits no tenderness.   Abdominal: Soft. Bowel sounds are normal. She exhibits no distension and no mass. There is no tenderness. There is no rebound and no guarding.   Neurological: She is alert and oriented to person, place, and time.   Strength 5/5 thoughout. Appropriate in conversation. Voice muffled.    Skin: Skin is warm and dry. She is not diaphoretic.       Meds:     Medications were reviewed:    Labs:     Results     Procedure Component Value Units Date/Time    Basic Metabolic Panel [[id number]]  (Abnormal) Collected:  02/02/15 0857    Specimen Information:  Blood Updated:  02/02/15 0945     Glucose 102 (H) mg/dL      BUN 3.0 (L) mg/dL      Creatinine 0.7 mg/dL      Calcium 8.2 (L) mg/dL      Sodium 409 mEq/L      Potassium 3.3 (L) mEq/L      Chloride 107 mEq/L      CO2 21 (L) mEq/L     CBC without differential [[id number]]  (Abnormal) Collected:  02/02/15 0857    Specimen Information:  Blood from Blood Updated:  02/02/15 0939     WBC 10.41 x10 3/uL      Hgb 11.2 (L) g/dL      Hematocrit 95.6 (L) %      Platelets 257 x10 3/uL      RBC 3.92 (L)  x10 6/uL      MCV 82.1 fL      MCH 28.6 pg      MCHC 34.8 g/dL      RDW 15 %      MPV 10.6 fL      Nucleated RBC 0 /100 WBC     CULTURE BLOOD AEROBIC AND ANAEROBIC [[id number]] Collected:  01/30/15 0136    Specimen Information:  Blood, Venipuncture Updated:  02/02/15 0521    Narrative:      ORDER#: [id number]                                    ORDERED BY: Nancie Neas  SOURCE: Blood, Venipuncture peripheral               COLLECTED:  01/30/15 01:36  ANTIBIOTICS AT COLL.:  RECEIVED :  01/30/15 04:37  Culture Blood Aerobic and Anaerobic        PRELIM      02/02/15 05:21  01/31/15   No Growth after 1 day/s of incubation.  02/01/15   No Growth after 2 day/s of incubation.  02/02/15   No Growth after 3 day/s of incubation.      CULTURE BLOOD AEROBIC AND ANAEROBIC [[id number]] Collected:  01/30/15 0121    Specimen Information:  Blood, Intravenous Line Updated:  02/02/15 0521    Narrative:      ORDER#: [id number]                                    ORDERED BY: Nancie Neas  SOURCE: Blood, Intravenous Line tlc                  COLLECTED:  01/30/15 01:21  ANTIBIOTICS AT COLL.:                                RECEIVED :  01/30/15 04:37  Culture Blood Aerobic and Anaerobic        PRELIM      02/02/15 05:21  01/31/15   No Growth after 1 day/s of incubation.  02/01/15   No Growth after 2 day/s of incubation.  02/02/15   No Growth after 3 day/s of incubation.            Microbiology, reviewed and are significant for:  Microbiology Results     Procedure Component Value Units Date/Time    CULTURE + Dierdre Forth [[id number]] Collected:  01/30/15 0121    Specimen Information:  Sputum from Bronchial Brushing Updated:  02/01/15 0842    Narrative:      ORDER#: [id number]                                    ORDERED BY: Nancie Neas  SOURCE: Bronchial Brushing et                        COLLECTED:  01/30/15 01:21  ANTIBIOTICS AT COLL.:                                RECEIVED :  01/30/15  04:38  Stain, Gram (Respiratory)                  FINAL       01/30/15 06:23  01/30/15   No Epithelial cells             Few WBC's             No organisms seen  Culture and Gram Stain, Aerobic, RespiratorFINAL       02/01/15 08:42  02/01/15   No growth      CULTURE BLOOD AEROBIC AND ANAEROBIC [[id number]] Collected:  01/30/15 0136    Specimen Information:  Blood, Venipuncture Updated:  02/01/15 0521    Narrative:      ORDER#: [id number]                                    ORDERED BY: Nancie Neas  SOURCE: Blood, Venipuncture peripheral               COLLECTED:  01/30/15 01:36  ANTIBIOTICS AT COLL.:                                RECEIVED :  01/30/15 04:37  Culture Blood Aerobic and Anaerobic        PRELIM      02/01/15 05:21  01/31/15   No Growth after 1 day/s of incubation.  02/01/15   No Growth after 2 day/s of incubation.      CULTURE BLOOD AEROBIC AND ANAEROBIC [[id number]] Collected:  01/30/15 0121    Specimen Information:  Blood, Intravenous Line Updated:  02/01/15 0521    Narrative:      ORDER#: [id number]                                    ORDERED BY: Nancie Neas  SOURCE: Blood, Intravenous Line tlc                  COLLECTED:  01/30/15 01:21  ANTIBIOTICS AT COLL.:                                RECEIVED :  01/30/15 04:37  Culture Blood Aerobic and Anaerobic        PRELIM      02/01/15 05:21  01/31/15   No Growth after 1 day/s of incubation.  02/01/15   No Growth after 2 day/s of incubation.      MRSA culture [[id number]] Collected:  01/28/15 0317    Specimen Information:  Body Fluid from Nasal/Throat ASC Admission Updated:  01/29/15 0840    Narrative:      ORDER#: [id number]                                    ORDERED BY: Herminio Heads  SOURCE: Nares and Throat                             COLLECTED:  01/28/15 03:17  ANTIBIOTICS AT COLL.:                                RECEIVED :  01/28/15 06:40  Culture MRSA Surveillance                  FINAL       01/29/15 08:40  01/29/15   Negative for Methicillin  Resistant Staph aureus from Nares and             Negative for Methicillin Resistant Staph aureus from Throat      Urine culture [[id number]] Collected:  01/30/15 0121    Specimen Information:  Bladder Urine Updated:  01/31/15 1257    Narrative:      ORDER#: [id number]                                    ORDERED BY: Nancie Neas  SOURCE: Urine  COLLECTED:  01/30/15 01:21  ANTIBIOTICS AT COLL.:                                RECEIVED :  01/30/15 14:39  Culture Urine                              FINAL       01/31/15 12:57  01/31/15   No growth of >1,000 CFU/ML, No further work              Imaging, reviewed and are significant for:  Microbiology Results     Procedure Component Value Units Date/Time    CULTURE + Dierdre Forth [[id number]] Collected:  01/30/15 0121    Specimen Information:  Sputum from Bronchial Brushing Updated:  02/01/15 0842    Narrative:      ORDER#: [id number]                                    ORDERED BY: Nancie Neas  SOURCE: Bronchial Brushing et                        COLLECTED:  01/30/15 01:21  ANTIBIOTICS AT COLL.:                                RECEIVED :  01/30/15 04:38  Stain, Gram (Respiratory)                  FINAL       01/30/15 06:23  01/30/15   No Epithelial cells             Few WBC's             No organisms seen  Culture and Gram Stain, Aerobic, RespiratorFINAL       02/01/15 08:42  02/01/15   No growth      CULTURE BLOOD AEROBIC AND ANAEROBIC [[id number]] Collected:  01/30/15 0136    Specimen Information:  Blood, Venipuncture Updated:  02/01/15 0521    Narrative:      ORDER#: [id number]                                    ORDERED BY: Nancie Neas  SOURCE: Blood, Venipuncture peripheral               COLLECTED:  01/30/15 01:36  ANTIBIOTICS AT COLL.:                                RECEIVED :  01/30/15 04:37  Culture Blood Aerobic and Anaerobic        PRELIM      02/01/15 05:21  01/31/15   No Growth after 1 day/s of  incubation.  02/01/15   No Growth after 2 day/s of incubation.      CULTURE BLOOD AEROBIC AND ANAEROBIC [[id number]] Collected:  01/30/15 0121    Specimen Information:  Blood, Intravenous Line Updated:  02/01/15 0521    Narrative:      ORDER#: [id number]  ORDERED BY: ALTAWEEL, LAITH  SOURCE: Blood, Intravenous Line tlc                  COLLECTED:  01/30/15 01:21  ANTIBIOTICS AT COLL.:                                RECEIVED :  01/30/15 04:37  Culture Blood Aerobic and Anaerobic        PRELIM      02/01/15 05:21  01/31/15   No Growth after 1 day/s of incubation.  02/01/15   No Growth after 2 day/s of incubation.      MRSA culture [[id number]] Collected:  01/28/15 0317    Specimen Information:  Body Fluid from Nasal/Throat ASC Admission Updated:  01/29/15 0840    Narrative:      ORDER#: [id number]                                    ORDERED BY: Herminio Heads  SOURCE: Nares and Throat                             COLLECTED:  01/28/15 03:17  ANTIBIOTICS AT COLL.:                                RECEIVED :  01/28/15 06:40  Culture MRSA Surveillance                  FINAL       01/29/15 08:40  01/29/15   Negative for Methicillin Resistant Staph aureus from Nares and             Negative for Methicillin Resistant Staph aureus from Throat      Urine culture [[id number]] Collected:  01/30/15 0121    Specimen Information:  Bladder Urine Updated:  01/31/15 1257    Narrative:      ORDER#: [id number]                                    ORDERED BY: Nancie Neas  SOURCE: Urine                                        COLLECTED:  01/30/15 01:21  ANTIBIOTICS AT COLL.:                                RECEIVED :  01/30/15 14:39  Culture Urine                              FINAL       01/31/15 12:57  01/31/15   No growth of >1,000 CFU/ML, No further work          Assessment:   [name] [name] is a 19 y.o. female w/o significant PMH who initially presented w/ headache, N/V. Pt was diagnosed w/ mono but presented w/  AMS. Pt was found to have meningitis. Pt had witnessed seizure x 3 in ER. Pt was started  on keppra, acyclovir, intubated for airway protection, transferred from OSH to NSICU on 01/28/15. Pt was seen by neurology, extubated yesterday. So far EEG has been showing encephalopathy but no seizure. Pt is now stable for transfer to [facility]. For Pt's ICU course, please refer to the following log.    12/17: cEEG placed yesterday in am. Had MRI. On PRVC, no weaning. On Propofol and Fentanyl gtt. Shivering off sedation. LGF. SBP stable. Foley reinserted due to urinary retention. Minimal secretions. No diarrhea, no rashes.    12/18: No seizures, encephalopathic tracing on cEEG. Off sedation in am. Failed CPAP in pm due to agitation and vomiting. On PRVC o/n. Agitated with am CPAP trial. Febrile to 101.1C. SBP stable.     12/19: Extubated at 10AM yesterday, following commands last night, at 0400 started talking, delayed response. Speech eval this AM, remain NPO. No fevers.     Principal Problem:   Meningitis  Active Problems:   Seizure   Mononucleosis    19 yo f w/o significant PMH p/w headache, N/V, diagnosed w/ mono, p/w AMS, found to have meningitis, witnessed seizure x 3 in ER, started on keppra, acyclovir, intubated for airway protection, transferred from OSH to NSICU on 01/28/15, seen by neurology, extubated 12/18, no seizure on EEG, now stable for transfer to [facility].   Plan:   Transfer to [facility].  Transfer orders per [facility].  Neurology consult.  Continue current hospital meds: keppra, acyclovir.  SLP consult.  Lovenox for DVT prophylaxis    Aseptic meningoencephalitis   - appreciate neurology consultation: "presented with AMS, new onset seizures (GTC, status epilepticus)CSF [facility] 01/27/15, lab # [phone] (micro): Clear, colorlessTube 4: RBC 0, WBC 27 (2 seg, 70 lymph, 28 mono), protein 247.9, glucose 75. Bacterial cx/GS: no growth @ 48h reported as of 12/18, GS: few WBCs, no organisms seenPending:  HSV PCR in process as of 12/18 Blood cx: no growth @ 48h as of 12/18 Most likely viral etiology based on CSF profile. MRI brain wwo 12/17 was normal."  - continue Keppra 1000mg  BID  - continue acyclovir pending HSV results- continuing to follow.   - PT/OT and speech    Seizures with Encephalopathy  Continue  Keppra   - appreciate neurology recommendations: "Encephalopathy - post-ictal vs medication effect vs meningoencephalitis related. Has not had seizures on cvEEG 12/16-12/18, but EEG is very slow even post extubation"  - great improving    Speech  - if does not improve will consult ENT    Lovenox for DVT prophylaxis    Calorie count  - adding ensure   - Pet Therapy       Signed by: Caesar Bookman, MD          Latanya Presser Devonta Blanford  02/02/2015

## 2015-02-02 NOTE — Progress Notes (Signed)
Patient has a very flat and depressed affect.  She started to eat more once her diet order was changed.  Small headache today but she did not want any meds for it.  Several uncontrollable bowel movements.  Times when she thought it was gas but was stool and needed her linen cleaned and changed. Voice tone still low and needing to repaet questions.  Responds very well to mom.

## 2015-02-02 NOTE — Progress Notes (Signed)
Pt arrived to 731 by bed and family in room.  She delayed to respond and follow commands, at times needing to reapeat or mother tells her to cooperate.  She whispers her responses.  No complaints. Up to bsc x1 for loose bm.  tol fluids.

## 2015-02-02 NOTE — SLP Eval Note (Signed)
[facility]   SLP Treatment Note  Patient: [name] [name]    MRN#: [id number]     Treatment Type: dysphagia f/u    Recommendations/Plan:   - Diet Recommendations: Upgrade to regular solids and thin liquids  - Aspiration Precautions: Standard precautions  - Consider pysch consult; pt may also benefit from pet therapy    SLP Frequency Recommended: 1x per week    Discharge recommendations: Rehab consult    Assessment:   Patient presents with evidence of WFL swallow function and no s/s aspiration. Difficult to r/o silent aspiration due to pt refusal to speak above a whisper, but strongly suspect this is entirely behavioral rather than a result of vocal fold dysfunction. SLP recommends upgrade to regular solids and thin liquids with f/u x1 for tolerance.     Subjective:   Pain: Denies  Current Diet: Full liquid diet  Respiratory Status: WNL  Precautions: Aspiration  RN cleared SLP for tx. [name] present and reports pt tolerated meal this morning. Patient is withdrawn and reluctant to participate. Patient left with call bell within reach, all needs met, fall mat in place, bed alarm activated and all questions answered. RN notified of session outcome and patient response.   Objective:   Objective:   Patient accepted thin liquids by straw and regular solids. Adequate mastication and oral transit. Adequate hyolaryngeal elevation and apparent timely response. One mild throat clear after oral trials were completed. No s/s aspiration or change in vital signs.     Patient Education: Verbal    Goals:   Patient will tolerate regular solids and thin liquids without s/s aspiration x24-48 hours. new     Matt Rudell Ortman MS, CCC-SLP    Time of Treatment:  SLP Received On: 02/02/15  Start Time: 1030  Stop Time: 1050  Time Calculation (min): 20 min

## 2015-02-02 NOTE — Progress Notes (Signed)
[facility] Neurology Consult Progress Note    Date Time: 02/02/2015   Patient Name: [name] [name],[name] [name]  Outpatient Neurologist: none    CC: headache, AMS    Assessment:   New onset seizures in a 19 y.o. AAF with no PMH who presents 01/27/15 with AMS followed by new onset seizures x3 in setting of 3-4 days of headache and vomiting. +Mono at urgent care. S/p intubation at OSH, extubated 12/18.    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 01/27/15, lab # [phone] (micro):    Clear, colorless   Tube 4: RBC 0, WBC 27 (2 seg, 70 lymph, 28 mono), protein 247.9, glucose 75   Bacterial cx/GS: no growth @ 48h reported as of 12/18, GS: few WBCs, no organisms seen   Pending: HSV PCR in process as of 12/18       Blood cx: no growth @ 48h as of 12/18        Most likely viral etiology based on CSF profile.         MRI brain wwo 12/17 was normal.    2. New onset seizures, 2/2 #1  Stable, on Keppra started at OSH.    3. Encephalopathy - post-ictal vs medication effect vs meningoencephalitis related.  Has not had seizures on cvEEG 12/16-12/18, but EEG is very slow even post extubation.    Recommendations:   - Continue Keppra 1000mg  BID  - Continue acyclovir until HSV results return. Bacterial meningitis coverage has been d/c'd.  - Continue medical management by primary team  - Supportive therapies: PT/OT/SLP  - Patient is likely O.K. For discharge from a Neurological stand point  - Follow up with Neurology as an outpatient in 3-4 weeks where Keppra will likely be titrated down    Neurology Attending Addendum:    I have personally reviewed the interval history, images and pertinent test results and I have personally examined the patient and confirmed the major physical findings of the preceding NP/PA note.  Also, I have noted any changes since the note was written as well as added additional findings and recommendations.    [name] [name] is doing quite well.  She is  much improved at this time.  She is coming by her mother and is verbalizing appropriately incoherently.  Does use all extremities appropriately and when requested.  .  Still awaiting CSF results, namely HSV.  .  This was discussed in detail with her mother as well as with the doctor Chewning.    Sinclair Ship, MD  [facility]  Neuro-hospitalist  [facility]  [employer] [phone]        Interval History/Subjective:   No acute neurological events reported or documented overnight.  Patient is awake, alert, answering questions and following commands appropriately.  Patient continues to answer questions with soft speech.  She seems to return to her baseline mental status. Patient has passed her swallow study and is now on a regular diet.     Medications:     Current Facility-Administered Medications   Medication Dose Route Frequency   . acyclovir  10 mg/kg (Order-Specific) Intravenous Q8H   . chlorhexidine  15 mL Mouth/Throat Q12H   . enoxaparin  40 mg Subcutaneous Daily   . levETIRAcetam  1,000 mg Intravenous Q12H   . senna  17.6 mg Oral BID       Review of Systems:   Neurological ROS negative except for that noted in  the History/Subjective.    Physical Exam:   Temp:  [98.5 F (36.9 C)-99.5 F (37.5 C)] 98.6 F (37 C)  Heart Rate:  [50-87] 79  Resp Rate:  [16-26] 17  BP: (96-117)/(46-71) 108/64 mmHg    Vital Signs: Reviewed.    General: well developed, well nourished young woman.  CV: regular rate and rhythm  Resp: breathing comfortably on nasal cannula    Mental Status: eyes open to voice, tracks me but seems to regard her parents sitting to her right. Patient reporting her name and location.  Speaking normally.  Follows commands.     Cranial nerves: PERRL. Gaze is conjugate, no preference of deviation noted. [name] is symmetric.     Motor: Muscle tone normal. Withdraws briskly to pain x4, seems to be moving both sides equally.  Muscle strength seems to be 4/5 throughout bilateral upper  and lower extremities.    Sensory: Light touch intact  Vibration intact  Temperature intact  Proprioception intact    Reflexes: Reflexes were +1 throughout bilateral upper and lower extremities Toes downgoing bilaterally.     Coordination: FTN intact but slow.  No tremors    Gait: Deferred due to concern for patient safety    Labs:     Results     Procedure Component Value Units Date/Time    Basic Metabolic Panel [[id number]]  (Abnormal) Collected:  02/02/15 0857    Specimen Information:  Blood Updated:  02/02/15 0945     Glucose 102 (H) mg/dL      BUN 3.0 (L) mg/dL      Creatinine 0.7 mg/dL      Calcium 8.2 (L) mg/dL      Sodium 829 mEq/L      Potassium 3.3 (L) mEq/L      Chloride 107 mEq/L      CO2 21 (L) mEq/L     CBC without differential [[id number]]  (Abnormal) Collected:  02/02/15 0857    Specimen Information:  Blood from Blood Updated:  02/02/15 0939     WBC 10.41 x10 3/uL      Hgb 11.2 (L) g/dL      Hematocrit 78.4 (L) %      Platelets 257 x10 3/uL      RBC 3.92 (L) x10 6/uL      MCV 82.1 fL      MCH 28.6 pg      MCHC 34.8 g/dL      RDW 15 %      MPV 10.6 fL      Nucleated RBC 0 /100 WBC     CULTURE BLOOD AEROBIC AND ANAEROBIC [[id number]] Collected:  01/30/15 0136    Specimen Information:  Blood, Venipuncture Updated:  02/02/15 0521    Narrative:      ORDER#: [id number]                                    ORDERED BY: Nancie Neas  SOURCE: Blood, Venipuncture peripheral               COLLECTED:  01/30/15 01:36  ANTIBIOTICS AT COLL.:                                RECEIVED :  01/30/15 04:37  Culture Blood Aerobic and Anaerobic        PRELIM      02/02/15 05:21  01/31/15   No Growth after 1 day/s of incubation.  02/01/15   No Growth after 2 day/s of incubation.  02/02/15   No Growth after 3 day/s of incubation.      CULTURE BLOOD AEROBIC AND ANAEROBIC [[id number]] Collected:  01/30/15 0121    Specimen Information:  Blood, Intravenous Line Updated:  02/02/15 0521    Narrative:      ORDER#: [id number]                                     ORDERED BY: Nancie Neas  SOURCE: Blood, Intravenous Line tlc                  COLLECTED:  01/30/15 01:21  ANTIBIOTICS AT COLL.:                                RECEIVED :  01/30/15 04:37  Culture Blood Aerobic and Anaerobic        PRELIM      02/02/15 05:21  01/31/15   No Growth after 1 day/s of incubation.  02/01/15   No Growth after 2 day/s of incubation.  02/02/15   No Growth after 3 day/s of incubation.            Rads:   Mri Brain W Wo Contrast    01/29/2015  1. The temporal lobes are normal. 2. No subarachnoid space abnormality is identified. 3. No structural fluid collection, cerebritis, or other worrisome process. Trilby Drummer, MD 01/29/2015 10:54 AM     Xr Chest Ap Portable    01/30/2015   Mild left basal atelectasis improving from the prior study. No new or progressive airspace opacity or other acute abnormality. Annabell Sabal, MD 01/30/2015 10:38 AM     Xr Chest Ap Portable (single View) Once Et/ng/og Tube And Line Placement    01/28/2015    1.  The endotracheal tube tip is 3 cm above the carina. 2.  The enteric tube tip is at the gastric antrum. 3.  Minimal left basilar atelectasis. Demetrios Isaacs, MD 01/28/2015 8:56 AM         Signed by:  Norvel Richards. Clemon Chambers, FNP-BC  Nurse Practitioner  [facility]  Pager: [phone]  Philis Kendall [phone]  After Hours: [phone]    Please see attending Neurologist note that accompanies this mid-level encounter note.

## 2015-02-02 NOTE — Consults (Signed)
Nutrition Consult - Calorie Count     A/D/I:    Recommend:   SLP recs - full liquid diet, cannot r/o silent aspiration  Added Ensure Enlive + Magic Cup with meals for added kcals, protein   Calorie count to start today - assess x 3 days - notified RN   RD to summarize if records available approx on 12/23 or 12/24 if still hospitalized     Clinical Update:  Hx HA, N/V dx with mono, p/w AMS, meningitis, encephalopathy, seizures  Labs: Glu 102, BUN 3, K+ 3.3   Meds: acyclovir, keppra, senokot, IVF @ 75 ml/hr     Diet / Nutrition Support Order: Full liquid diet     M/E:  Monitor po, calorie count results, GI, med tx plan     Georgina Pillion, RD; Philis Kendall [phone]

## 2015-02-03 LAB — CBC
Hematocrit: 34 % — ABNORMAL LOW (ref 37.0–47.0)
Hgb: 11.4 g/dL — ABNORMAL LOW (ref 12.0–16.0)
MCH: 27.6 pg — ABNORMAL LOW (ref 28.0–32.0)
MCHC: 33.5 g/dL (ref 32.0–36.0)
MCV: 82.3 fL (ref 80.0–100.0)
MPV: 10.9 fL (ref 9.4–12.3)
Nucleated RBC: 0 /100 WBC (ref 0–1)
Platelets: 282 10*3/uL (ref 140–400)
RBC: 4.13 10*6/uL — ABNORMAL LOW (ref 4.20–5.40)
RDW: 15 % (ref 12–15)
WBC: 9.66 10*3/uL (ref 3.50–10.80)

## 2015-02-03 LAB — BASIC METABOLIC PANEL
BUN: 2 mg/dL — ABNORMAL LOW (ref 7.0–19.0)
CO2: 25 mEq/L (ref 22–29)
Calcium: 8.2 mg/dL — ABNORMAL LOW (ref 8.5–10.5)
Chloride: 105 mEq/L (ref 100–111)
Creatinine: 0.7 mg/dL (ref 0.6–1.0)
Glucose: 86 mg/dL (ref 70–100)
Potassium: 3.1 mEq/L — ABNORMAL LOW (ref 3.5–5.1)
Sodium: 139 mEq/L (ref 136–145)

## 2015-02-03 MED ORDER — POTASSIUM CHLORIDE 20 MEQ PO PACK
40.0000 meq | PACK | Freq: Once | ORAL | Status: AC
Start: 2015-02-03 — End: 2015-02-03
  Administered 2015-02-03: 40 meq via ORAL
  Filled 2015-02-03: qty 2

## 2015-02-03 NOTE — OT Eval Note (Signed)
[facility]   Occupational Therapy Evaluation     Patient: [name] [name]    MRN#: [id number]   Unit: [facility] TOWER 4  Bed: F439/F439.01                                     Discharge Recommendations:   Discharge Recommendation: Home with supervision              Assessment:   [name] [name] is a 19 y.o. female admitted 01/28/2015 with decreased activity tolerance, generalized weakness, decreased attention to task, and flat affect. Pt scored 4 on short blessed test indicating little to no impairment; deficit noted in area of attention.  Pt requiring supervision to CGA for functional transfers and BADl's and would continue to benefit from skilled Occupational Therapy services to maximize independent participation in BADL's and assist in patient's return to functional baseline.     Therapy Diagnosis: Decreased balance, decreased strength  Rehabilitation Potential:  Good for stated goals with continued Occupational Therapy treatment intervention    Treatment Activities: OT evaluation,  patient/family received education on d/c plan, modified BADL tasks, energy conservation strategies, and safety and supervision to be provided upon d/c home.  [name] and pt demonstrates understanding and carryover.   Educated the patient to role of occupational therapy, plan of care, goals of therapy and safety with mobility and ADLs, discharge instructions, home safety.    Plan:   OT Frequency Recommended: 1-2x/wk     Treatment/Interventions: ADL training, functional transfer training, modified bed mobility training, education on energy conservation/fall prevention techniques, compensatory technique education, UE strengthening, endurance training, Relaxation/pain management techniques, education on medical equipment beneficial for increasing ADL performance and safety in home environment.        Risks/benefits/POC discussed: Yes       Precautions and Contraindications:   Fall risk, seizure precautions,  and possible C-diff on contact isolation, bathroom priv. per MD     Consult received for Darlin Priestly for OT Evaluation and Treatment.  Patient's medical condition is appropriate for Occupational Therapy intervention at this time.      History of Present Illness:    [name] [name] is a 19 y.o. female admitted on 01/28/2015 with [name] [name] is a 19 y.o. female w/o significant PMH who initially presented w/ headache, N/V. Pt was diagnosed w/ mono but presented w/ AMS. Pt was found to have meningitis. Pt had witnessed seizure x 3 in ER. Pt was started on keppra, acyclovir, intubated for airway protection, transferred from OSH to NSICU on 01/28/15. Pt was seen by neurology, extubated yesterday. So far EEG has been showing encephalopathy but no seizure. Pt is now stable for transfer to [facility]. For Pt's ICU course, please refer to the following log.    12/17: cEEG placed yesterday in am. Had MRI. On PRVC, no weaning. On Propofol and Fentanyl gtt. Shivering off sedation. LGF. SBP stable. Foley reinserted due to urinary retention. Minimal secretions. No diarrhea, no rashes.    12/18: No seizures, encephalopathic tracing on cEEG. Off sedation in am. Failed CPAP in pm due to agitation and vomiting. On PRVC o/n. Agitated with am CPAP trial. Febrile to 101.1C. SBP stable.     12/19: Extubated at 10AM yesterday, following commands last night, at 0400 started talking, delayed response. Speech eval this AM, remain NPO. No fevers.     Admitting Diagnosis: Altered mental status [R41.82]  Viral meningitis [A87.9]    Past Medical/Surgical History:  No past medical history on file.  No past surgical history on file.    Imaging/Tests/Labs:  Mri Brain W Wo Contrast    01/29/2015  1. The temporal lobes are normal. 2. No subarachnoid space abnormality is identified. 3. No structural fluid collection, cerebritis, or other worrisome process. Trilby Drummer, MD 01/29/2015 10:54 AM     Xr Chest Ap Portable    01/30/2015   Mild left  basal atelectasis improving from the prior study. No new or progressive airspace opacity or other acute abnormality. Annabell Sabal, MD 01/30/2015 10:38 AM     Xr Chest Ap Portable (single View) Once Et/ng/og Tube And Line Placement    01/28/2015    1.  The endotracheal tube tip is 3 cm above the carina. 2.  The enteric tube tip is at the gastric antrum. 3.  Minimal left basilar atelectasis. 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 currently on winter break at parents house and will be able to provide supervision and support as needed upon d/c.   Assistive Devices: none   Baseline Activity: independent, community mobility   DME Currently at Home: none  Home Living Arrangements: none  Type of Home: multi level home   Home Layout: 2nd floor set up, family states she can have 1st floor set up at home.     Subjective:    Patient is agreeable to participation in the therapy session.     Patient Goal: To go home   Pain: none reported       Objective:   Patient is in bed with telemetry, SCD's and peripheral IV in place, [facility] lines       Cognitive Status and Neuro Exam:  Pt scored 4 on short blessed test indicating little to no impairment; deficit noted in area of attention.  Decreased safety and awareness in regards to line management requiring vc's to attend to IV line/pole when ambulating.  Pt with delayed responses requiring vc's to initiate task at times however appears to be behavioral.  Pt with flat affect.     Musculoskeletal Examination  RUE ROM: WFL   LUE ROM:  WFL   RLE ROM:  grossly WFL  LLE ROM: grossly WFL    RUE Strength: 4+/5  LUE Strength: 4+/5  RLE Strength: 4+/5  LLE Strength: 4+/5    Coordination: WFL         Sensory/Oculomotor Examination  Auditory:  appears WFL, pt able to Agricultural engineer without noted difficulty   Tactile:  grossly intact  Vision:  appears WFL; no deficits noted in acuity or  tracking      Activities of Daily Living  Eating: hand to mouth WFL   Grooming: sink side 3 minutes with supervision level    Bathing: NT  UE Dressing: set up seated   LE Dressing: mod I seated EOB   Toileting: supervision set up     Functional Mobility:  Supine to Sit: CGA   Sit to Stand: CGA  Transfers: CGA progressing to supervision      Balance  Static Sitting: supervision   Dynamic Sitting: mod I seated supported   Static Standing: mod I   Dynamic Standing: supervision     Participation and Activity Tolerance  Participation Effort: fair  Endurance: fair +  Patient left with call bell within reach, all needs met, SCDs on, fall mat in place, bed alarm , chair alarm on  and all questions answered. RN notified of session outcome and patient response.       Goals:  Time For Goal Achievement: 3 visits     Mobility and Transfer Goals  Pt will perform bathtub transfer: Supervision, 3 visits  Neuro Re-Ed Goals  Pt will perform dynamic standing balance: for 15 minutes, to complete standing ADLs safely, Modified Independent, 3 visits     Executive Fucntion Goals  Pt will demonstrate safety: independent, 3 visits                Chancy Hurter OTR/L   Pager # [phone]    Time of treatment:   OT Received On: 02/03/15  Start Time: 1345  Stop Time: 1446  Time Calculation (min): 61 min

## 2015-02-03 NOTE — Plan of Care (Signed)
Problem: Safety  Goal: Patient will be free from injury during hospitalization  Outcome: Progressing  Fall precautions in place, bed alarm on, floor mats at bedside, bed in lowest position.     Problem: Tissue integrity  Goal: Nutritional Status Improving  Outcome: Progressing  Continuing to monitor intake. Poor PO intake. One portion of ice cream for dinner. Minimal fluid intake. Encouraging small snacks/fluids.     Problem: Urinary Incontinence  Goal: Perineal skin integrity is maintained or improved  Assess genitourinary system, perineal skin, labs (urinalysis), and history of incontinence to include past management, aggravating, and alleviating factors. Collaborate with interdisciplinary team and initiate plans and interventions as needed.   Intervention: Keep skin clean and dry  Frequent BM and voiding. Using bedside commode for bathroom use. Peri care provided as needed.      Problem: Neurological Deficit  Goal: Neurological status is stable or improving  Outcome: Progressing  Q4 full Neuro exams. AOx4, Pupils equal 3mm, round, brisk. Delayed responses, low voice. Flat affect. MAE. BUE equal 5/5 strength. BLE 4+/5 strength. Sensation intact. Refer to complex and complete exam for full assessment.

## 2015-02-03 NOTE — Progress Notes (Signed)
MEDICINE PROGRESS NOTE    Date Time: 02/03/2015 1:21 PM  Patient Name: [name] [name],[name] [name]  Attending Physician: Caesar Bookman, MD        Subjective     She is doing much better. She is even more interactive and conversant today. Her appetite and voice are improved.       Blood pressure 104/63, pulse 77, temperature 98 F (36.7 C), temperature source Oral, resp. rate 23, height 1.6 m (5' 2.99"), weight 57.2 kg (126 lb 1.7 oz), SpO2 99 %.    ROS   A 5 point review of symptoms was preformed and was otherwise negative.     Physical Exam   Constitutional: She is oriented to person, place, and time. She appears well-developed and well-nourished. No distress.   Cardiovascular: Normal rate, regular rhythm and normal heart sounds.  Exam reveals no gallop and no friction rub.    No murmur heard.  Pulmonary/Chest: Effort normal and breath sounds normal. No respiratory distress. She has no wheezes. She has no rales. She exhibits no tenderness.   Abdominal: Soft. Bowel sounds are normal. She exhibits no distension and no mass. There is no tenderness. There is no rebound and no guarding.   Neurological: She is alert and oriented to person, place, and time.   Strength 5/5 thoughout. Appropriate in conversation. Voice is improved.   Skin: Skin is warm and dry. She is not diaphoretic.       Meds:     Medications were reviewed:    Labs:     Results     Procedure Component Value Units Date/Time    Basic Metabolic Panel [[id number]]  (Abnormal) Collected:  02/03/15 0848    Specimen Information:  Blood Updated:  02/03/15 1011     Glucose 86 mg/dL      BUN 2.0 (L) mg/dL      Creatinine 0.7 mg/dL      Calcium 8.2 (L) mg/dL      Sodium 409 mEq/L      Potassium 3.1 (L) mEq/L      Chloride 105 mEq/L      CO2 25 mEq/L     CBC without differential [[id number]]  (Abnormal) Collected:  02/03/15 0848    Specimen Information:  Blood from Blood Updated:  02/03/15 0953     WBC 9.66 x10 3/uL      Hgb 11.4 (L) g/dL      Hematocrit 95.6 (L) %       Platelets 282 x10 3/uL      RBC 4.13 (L) x10 6/uL      MCV 82.3 fL      MCH 27.6 (L) pg      MCHC 33.5 g/dL      RDW 15 %      MPV 10.9 fL      Nucleated RBC 0 /100 WBC     CULTURE BLOOD AEROBIC AND ANAEROBIC [[id number]] Collected:  01/30/15 0136    Specimen Information:  Blood, Venipuncture Updated:  02/03/15 0521    Narrative:      ORDER#: [id number]                                    ORDERED BY: Nancie Neas  SOURCE: Blood, Venipuncture peripheral               COLLECTED:  01/30/15 01:36  ANTIBIOTICS AT COLL.:  RECEIVED :  01/30/15 04:37  Culture Blood Aerobic and Anaerobic        PRELIM      02/03/15 05:21  01/31/15   No Growth after 1 day/s of incubation.  02/01/15   No Growth after 2 day/s of incubation.  02/02/15   No Growth after 3 day/s of incubation.  02/03/15   No Growth after 4 day/s of incubation.      CULTURE BLOOD AEROBIC AND ANAEROBIC [[id number]] Collected:  01/30/15 0121    Specimen Information:  Blood, Intravenous Line Updated:  02/03/15 0521    Narrative:      ORDER#: [id number]                                    ORDERED BY: Nancie Neas  SOURCE: Blood, Intravenous Line tlc                  COLLECTED:  01/30/15 01:21  ANTIBIOTICS AT COLL.:                                RECEIVED :  01/30/15 04:37  Culture Blood Aerobic and Anaerobic        PRELIM      02/03/15 05:21  01/31/15   No Growth after 1 day/s of incubation.  02/01/15   No Growth after 2 day/s of incubation.  02/02/15   No Growth after 3 day/s of incubation.  02/03/15   No Growth after 4 day/s of incubation.            Microbiology, reviewed and are significant for:  Microbiology Results     Procedure Component Value Units Date/Time    CULTURE + Dierdre Forth [[id number]] Collected:  01/30/15 0121    Specimen Information:  Sputum from Bronchial Brushing Updated:  02/01/15 0842    Narrative:      ORDER#: [id number]                                    ORDERED BY: Nancie Neas  SOURCE:  Bronchial Brushing et                        COLLECTED:  01/30/15 01:21  ANTIBIOTICS AT COLL.:                                RECEIVED :  01/30/15 04:38  Stain, Gram (Respiratory)                  FINAL       01/30/15 06:23  01/30/15   No Epithelial cells             Few WBC's             No organisms seen  Culture and Gram Stain, Aerobic, RespiratorFINAL       02/01/15 08:42  02/01/15   No growth      CULTURE BLOOD AEROBIC AND ANAEROBIC [[id number]] Collected:  01/30/15 0136    Specimen Information:  Blood, Venipuncture Updated:  02/01/15 0521    Narrative:      ORDER#: [id number]  ORDERED BY: ALTAWEEL, LAITH  SOURCE: Blood, Venipuncture peripheral               COLLECTED:  01/30/15 01:36  ANTIBIOTICS AT COLL.:                                RECEIVED :  01/30/15 04:37  Culture Blood Aerobic and Anaerobic        PRELIM      02/01/15 05:21  01/31/15   No Growth after 1 day/s of incubation.  02/01/15   No Growth after 2 day/s of incubation.      CULTURE BLOOD AEROBIC AND ANAEROBIC [[id number]] Collected:  01/30/15 0121    Specimen Information:  Blood, Intravenous Line Updated:  02/01/15 0521    Narrative:      ORDER#: [id number]                                    ORDERED BY: Nancie Neas  SOURCE: Blood, Intravenous Line tlc                  COLLECTED:  01/30/15 01:21  ANTIBIOTICS AT COLL.:                                RECEIVED :  01/30/15 04:37  Culture Blood Aerobic and Anaerobic        PRELIM      02/01/15 05:21  01/31/15   No Growth after 1 day/s of incubation.  02/01/15   No Growth after 2 day/s of incubation.      MRSA culture [[id number]] Collected:  01/28/15 0317    Specimen Information:  Body Fluid from Nasal/Throat ASC Admission Updated:  01/29/15 0840    Narrative:      ORDER#: [id number]                                    ORDERED BY: Herminio Heads  SOURCE: Nares and Throat                             COLLECTED:  01/28/15 03:17  ANTIBIOTICS AT COLL.:                                 RECEIVED :  01/28/15 06:40  Culture MRSA Surveillance                  FINAL       01/29/15 08:40  01/29/15   Negative for Methicillin Resistant Staph aureus from Nares and             Negative for Methicillin Resistant Staph aureus from Throat      Urine culture [[id number]] Collected:  01/30/15 0121    Specimen Information:  Bladder Urine Updated:  01/31/15 1257    Narrative:      ORDER#: [id number]                                    ORDERED BY: Nancie Neas  SOURCE: Urine  COLLECTED:  01/30/15 01:21  ANTIBIOTICS AT COLL.:                                RECEIVED :  01/30/15 14:39  Culture Urine                              FINAL       01/31/15 12:57  01/31/15   No growth of >1,000 CFU/ML, No further work              Imaging, reviewed and are significant for:  Microbiology Results     Procedure Component Value Units Date/Time    CULTURE + Dierdre Forth [[id number]] Collected:  01/30/15 0121    Specimen Information:  Sputum from Bronchial Brushing Updated:  02/01/15 0842    Narrative:      ORDER#: [id number]                                    ORDERED BY: Nancie Neas  SOURCE: Bronchial Brushing et                        COLLECTED:  01/30/15 01:21  ANTIBIOTICS AT COLL.:                                RECEIVED :  01/30/15 04:38  Stain, Gram (Respiratory)                  FINAL       01/30/15 06:23  01/30/15   No Epithelial cells             Few WBC's             No organisms seen  Culture and Gram Stain, Aerobic, RespiratorFINAL       02/01/15 08:42  02/01/15   No growth      CULTURE BLOOD AEROBIC AND ANAEROBIC [[id number]] Collected:  01/30/15 0136    Specimen Information:  Blood, Venipuncture Updated:  02/01/15 0521    Narrative:      ORDER#: [id number]                                    ORDERED BY: Nancie Neas  SOURCE: Blood, Venipuncture peripheral               COLLECTED:  01/30/15 01:36  ANTIBIOTICS AT COLL.:                                RECEIVED  :  01/30/15 04:37  Culture Blood Aerobic and Anaerobic        PRELIM      02/01/15 05:21  01/31/15   No Growth after 1 day/s of incubation.  02/01/15   No Growth after 2 day/s of incubation.      CULTURE BLOOD AEROBIC AND ANAEROBIC [[id number]] Collected:  01/30/15 0121    Specimen Information:  Blood, Intravenous Line Updated:  02/01/15 0521    Narrative:      ORDER#: [id number]  ORDERED BY: ALTAWEEL, LAITH  SOURCE: Blood, Intravenous Line tlc                  COLLECTED:  01/30/15 01:21  ANTIBIOTICS AT COLL.:                                RECEIVED :  01/30/15 04:37  Culture Blood Aerobic and Anaerobic        PRELIM      02/01/15 05:21  01/31/15   No Growth after 1 day/s of incubation.  02/01/15   No Growth after 2 day/s of incubation.      MRSA culture [[id number]] Collected:  01/28/15 0317    Specimen Information:  Body Fluid from Nasal/Throat ASC Admission Updated:  01/29/15 0840    Narrative:      ORDER#: [id number]                                    ORDERED BY: Herminio Heads  SOURCE: Nares and Throat                             COLLECTED:  01/28/15 03:17  ANTIBIOTICS AT COLL.:                                RECEIVED :  01/28/15 06:40  Culture MRSA Surveillance                  FINAL       01/29/15 08:40  01/29/15   Negative for Methicillin Resistant Staph aureus from Nares and             Negative for Methicillin Resistant Staph aureus from Throat      Urine culture [[id number]] Collected:  01/30/15 0121    Specimen Information:  Bladder Urine Updated:  01/31/15 1257    Narrative:      ORDER#: [id number]                                    ORDERED BY: Nancie Neas  SOURCE: Urine                                        COLLECTED:  01/30/15 01:21  ANTIBIOTICS AT COLL.:                                RECEIVED :  01/30/15 14:39  Culture Urine                              FINAL       01/31/15 12:57  01/31/15   No growth of >1,000 CFU/ML, No further work          Assessment:   Abigal Choung is a 19 y.o. female w/o significant PMH who initially presented w/ headache, N/V. Pt was diagnosed w/ mono but presented w/ AMS. Pt was found to have meningitis. Pt had witnessed seizure x 3 in ER. Pt was started  on keppra, acyclovir, intubated for airway protection, transferred from OSH to NSICU on 01/28/15. Pt was seen by neurology, extubated yesterday. So far EEG has been showing encephalopathy but no seizure. Pt is now stable for transfer to [facility]. For Pt's ICU course, please refer to the following log.    12/17: cEEG placed yesterday in am. Had MRI. On PRVC, no weaning. On Propofol and Fentanyl gtt. Shivering off sedation. LGF. SBP stable. Foley reinserted due to urinary retention. Minimal secretions. No diarrhea, no rashes.    12/18: No seizures, encephalopathic tracing on cEEG. Off sedation in am. Failed CPAP in pm due to agitation and vomiting. On PRVC o/n. Agitated with am CPAP trial. Febrile to 101.1C. SBP stable.     12/19: Extubated at 10AM yesterday, following commands last night, at 0400 started talking, delayed response. Speech eval this AM, remain NPO. No fevers.     Principal Problem:   Meningitis  Active Problems:   Seizure   Mononucleosis    19 yo f w/o significant PMH p/w headache, N/V, diagnosed w/ mono, p/w AMS, found to have meningitis, witnessed seizure x 3 in ER, started on keppra, acyclovir, intubated for airway protection, transferred from OSH to NSICU on 01/28/15, seen by neurology, extubated 12/18, no seizure on EEG, now stable for transfer to [facility].   Plan:   Transfer to [facility].  Transfer orders per [facility].  Neurology consult.  Continue current hospital meds: keppra, acyclovir.  SLP consult.  Lovenox for DVT prophylaxis    Aseptic meningoencephalitis   - appreciate neurology consultation: "Continue Keppra 1000mg  BID- Continue acyclovir until HSV results return. Bacterial meningitis coverage has been d/c'd.- Continue medical management by primary team  - Supportive therapies:  PT/OT/SLP- Patient is likely O.K. For discharge from a Neurological stand point- Follow up with Neurology as an outpatient in 3-4 weeks where Keppra will likely be titrated down Neurology Attending Addendum: I have personally reviewed the interval history, images and pertinent test results and I have personally examined the patient and confirmed the major physical findings of the preceding NP/PA note. Also, I have noted any changes since the note was written as well as added additional findings and recommendations.  Daniel is doing quite well. She is much improved at this time. She is coming by her mother and is verbalizing appropriately incoherently. Does use all extremities appropriately and when requested.. Still awaiting CSF results, namely HSV. This was discussed in detail with her mother as well as with the doctor Khaden Gater."  - continue Keppra 1000mg  BID  - continue acyclovir pending HSV results- continuing to follow. Discussed with neurology and they are checking the HSV to determine result.   - PT/OT and speech    Seizures with Encephalopathy  Continue  Keppra   - appreciate neurology recommendations: "Encephalopathy - post-ictal vs medication effect vs meningoencephalitis related. Has not had seizures on cvEEG 12/16-12/18, but EEG is very slow even post extubation"  - greatly improving    Diarrhea  - Cdiff pending    Speech  - continues to improve, if does not improve further will consult ENT    Lovenox for DVT prophylaxis    Calorie count  - adding ensure   - continue to monitor and replace electrolytes.   - Pet Therapy     PT/OT    Evaluating with PT to determine to discharge needs      Signed by: Caesar Bookman, MD          Latanya Presser Enes Wegener  02/03/2015

## 2015-02-03 NOTE — Progress Notes (Addendum)
Pt received on Stroke unit at 1855 after report received from [facility], on 4th floor [facility]. Pt and mother oriented to room. Fall and seizure precautions in place. Pt placed on contact isolation.

## 2015-02-03 NOTE — Plan of Care (Signed)
Pt is A/O x 4, MAE and FC. Flat affect and non-interactive, often times questions need to be asked several times before pt answers. VSS on RA. See doc flowsheet for details and trends. Diarrhea x 1. Stool sample needs to be collected to r/o C. Diff. AUO via commode. No c/o pain. Will continue to monitor.

## 2015-02-04 ENCOUNTER — Other Ambulatory Visit: Payer: Self-pay

## 2015-02-04 LAB — URINALYSIS WITH MICROSCOPIC
Bilirubin, UA: NEGATIVE
Blood, UA: NEGATIVE
Glucose, UA: NEGATIVE
Ketones UA: NEGATIVE
Leukocyte Esterase, UA: NEGATIVE
Nitrite, UA: NEGATIVE
Protein, UR: NEGATIVE
Specific Gravity UA: 1.008 (ref 1.001–1.035)
Urine pH: 7 (ref 5.0–8.0)
Urobilinogen, UA: NORMAL mg/dL

## 2015-02-04 LAB — BASIC METABOLIC PANEL
BUN: 2 mg/dL — ABNORMAL LOW (ref 7.0–19.0)
CO2: 27 mEq/L (ref 22–29)
Calcium: 8.5 mg/dL (ref 8.5–10.5)
Chloride: 107 mEq/L (ref 100–111)
Creatinine: 0.7 mg/dL (ref 0.6–1.0)
Glucose: 123 mg/dL — ABNORMAL HIGH (ref 70–100)
Potassium: 3.5 mEq/L (ref 3.5–5.1)
Sodium: 142 mEq/L (ref 136–145)

## 2015-02-04 LAB — CBC
Hematocrit: 32.7 % — ABNORMAL LOW (ref 37.0–47.0)
Hgb: 11 g/dL — ABNORMAL LOW (ref 12.0–16.0)
MCH: 28 pg (ref 28.0–32.0)
MCHC: 33.6 g/dL (ref 32.0–36.0)
MCV: 83.2 fL (ref 80.0–100.0)
MPV: 10.5 fL (ref 9.4–12.3)
Nucleated RBC: 0 /100 WBC (ref 0–1)
Platelets: 295 10*3/uL (ref 140–400)
RBC: 3.93 10*6/uL — ABNORMAL LOW (ref 4.20–5.40)
RDW: 15 % (ref 12–15)
WBC: 7.17 10*3/uL (ref 3.50–10.80)

## 2015-02-04 LAB — PHOSPHORUS: Phosphorus: 3 mg/dL (ref 2.3–4.7)

## 2015-02-04 LAB — MAGNESIUM: Magnesium: 1.8 mg/dL (ref 1.3–2.2)

## 2015-02-04 MED ORDER — ACYCLOVIR 200 MG PO CAPS
600.0000 mg | ORAL_CAPSULE | Freq: Three times a day (TID) | ORAL | 0 refills | Status: AC
Start: 2015-02-04 — End: ?
  Filled 2015-02-04: qty 18, 2d supply, fill #0

## 2015-02-04 MED ORDER — LEVETIRACETAM 500 MG PO TABS
1000.0000 mg | ORAL_TABLET | Freq: Two times a day (BID) | ORAL | 1 refills | Status: AC
Start: 2015-02-04 — End: ?
  Filled 2015-02-04: qty 120, 30d supply, fill #0

## 2015-02-04 NOTE — PT Eval Note (Signed)
[facility]   Physical Therapy Evaluation     Patient: [name] [name]    MRN#: [id number]   Unit: [facility] ([facility]) TOWER 6 EAST  Bed: F620/F620.01    Discharge Recommendations:   Discharge Recommendation: Home with no needs (with family A)       DME Recommendation: may need FWW or SPC depending on progression    If Home with no needs (with family A) recommended discharge disposition is not available, patient will need CG assist for ambulation, RW equipment, and Home with family A.        Assessment:   [name] [name] is a 19 y.o. female admitted 01/28/2015 with decreased strength and balance in standing, who would benefit from inpatient PT services for gait and stair training.     Therapy Diagnosis: decreased balance in standing    Rehabilitation Potential: good    Treatment Activities: gait training; stair training  Educated the patient to role of physical therapy, plan of care, goals of therapy and HEP.    Plan:   PT Frequency: 4-5x/wk (1-2 more visits)    Treatment/Interventions: gait and stair training    Risks/benefits/POC discussed with pt/family       Precautions and Contraindications:   Fall risk    Consult received for [name] [name] for PT Evaluation and Treatment.  Patient's medical condition is appropriate for Physical Therapy intervention at this time.    History of Present Illness:   [name] [name] is a 19 y.o. female admitted on 01/28/2015 with Headache starting Monday, recurrent vomitting from tuesday. Went to urgent care on Wednesday dec 14, diag with Mono, given zofran, couldn't keep it down.  Altered mental status on Thursday dec 15 around 1500 and family decided tot take to ER. Had 3 seizures there and recieved ativan and keppra. Eventually intubated for airway protection.  TX to [facility].      Medical Diagnosis: Altered mental status [R41.82]  Viral meningitis [A87.9]    Past Medical/Surgical History:  No past medical history on file.  Imaging/Tests/Labs:    MRI  IMPRESSION:        1. The temporal lobes are normal.    2. No subarachnoid space abnormality is identified.  3. No structural fluid collection, cerebritis, or other worrisome  process.      Social History:   Prior Level of Function: I with all ADLs and ambulation  Assistive Devices: none  Baseline Activity: Tourist information centre manager; attending college  DME Currently at Home: none  Home Living Arrangements: lives with parents  Type of Home: mulitilevel  Home Layout: can be set up on first level; several steps to enter house    Subjective:   Patient is agreeable to participation in the therapy session. Nursing clears patient for therapy.     Patient Goal: Go home    Pain:   Scale: No c/o pain  Location: NA  Intervention: NA    Objective:   Patient is in bed with no medical equipment in place.      Cognitive Status and Neuro Exam:  Alert and oriented; flat affect     Musculoskeletal Examination  RLE ROM: wfl  LLE ROM: wfl    RLE Strength: 4/5  LLE Strength: 4/5    Functional Mobility  Rolling: I  Supine to Sit: I  Scooting: I  Sit to Stand: CG  Stand to Sit: CG  Transfers: CG    Ambulation  Level of assistance required: CG  Ambulation Distance: 100 ft  Pattern: initial loss of balance standing without FWW  Device Used: FWW  Weightbearing Status: FWB    Balance  Static Sitting: 4/5  Dynamic Sitting: 4/5  Static Standing: 3/5  Dynamic Standing: 3-/5    Participation and Activity Tolerance  Participation Effort: good  Endurance: fair; felt "tired" after ambulating 100 ft with FWW    Patient left with call bell within reach, all needs met, SCDs off, fall mat on, bed alarm off, chair alarm off (parents present) and all questions answered. RN notified of session outcome and patient response.     Goals:  Goals  Goal Formulation: With patient/family  Time for Goal Acheivement: 3 visits  Pt Will Ambulate: > 200 feet, with single point cane, modified independent, 3 visits  Pt Will Go Up / Down Stairs: 3-5 stairs, modified independent, With rail, With  [facility], 3 visits          Time of Treatment  PT Received On: 02/04/15  Start Time: 0815  Stop Time: 0900  Time Calculation (min): 45 min     Thank you, [name] [name], MSPT # [phone]

## 2015-02-04 NOTE — UM Notes (Signed)
Inpatient CSR 02/04/15    19 y.o. AAF with no PMH who presents 01/27/15 with AMS followed by new onset seizures x3 in setting of 3-4 days of headache and vomiting. +Mono at urgent care. S/p intubation at OSH, extubated 12/18.    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 01/27/15, lab # [phone] (micro):   Clear, colorless  Tube 4: RBC 0, WBC 27 (2 seg, 70 lymph, 28 mono), protein 247.9, glucose 75  Bacterial cx/GS: no growth @ 48h reported as of 12/18, GS: few WBCs, no organisms seen     Most likely viral etiology based on CSF profile.      MRI brain wwo 12/17 was normal.    2. New onset seizures, 2/2 #1  Stable, on Keppra started at OSH.    3. Encephalopathy - post-ictal vs medication effect vs meningoencephalitis related.  Has not had seizures on cvEEG 12/16-12/18, but EEG is very slow even post extubation.    Neurology recommendations 12/23: No acute neurological events reported or documented overnight.Patient is awake, alert, answering questions and following commands appropriately.Patient seems to have returned to her baseline mental status. Patient has passed her swallow study and is now on a regular diet. She denies headache, dizziness, n/v, weakness, paresthesias and visual disturbance.  - Continue Keppra 1000mg  BID  - Continue acyclovir until HSV results return. Bacterial meningitis coverage has been d/c'd.  - Continue medical management by primary team  - Supportive therapies: PT/OT/SLP  - Patient is likely O.K. For discharge from a Neurological stand point  - Follow up with Neurology as an outpatient in 3-4 weeks where Keppra will likely be titrated down    Labs: UA negative, Glu 123, BUN 2.0, H&H 11/32.7  VS: T97.9, P75, R18, 121/76, 92%  Acyclovir 550mg  IV q8hrs

## 2015-02-04 NOTE — Discharge Summary (Signed)
[facility] Discharge Summary      Patient: [name] [name]  Admission Date: 01/28/2015   DOB: 01-12-1996  Discharge Date: 02/04/2015    MRN: [id number]  Discharge Attending: Kari Baars MD   Referring Physician: Valentino Nose, MD  PCP: Valentino Nose, MD       DISCHARGE SUMMARY     Discharge Information   Admission Diagnosis:   Seizure     Discharge Diagnosis:   Patient Active Problem List    Diagnosis Date Noted   . Seizure 01/31/2015   . Mononucleosis 01/31/2015   . Meningitis 01/28/2015        Discharge Medications:     Medication List      START taking these medications          acyclovir 200 MG capsule   Commonly known as:  ZOVIRAX   Take 3 capsules (600 mg total) by mouth 3 (three) times daily.       levETIRAcetam 500 MG tablet   Commonly known as:  KEPPRA   Take 2 tablets (1,000 mg total) by mouth 2 (two) times daily.         STOP taking these medications          albuterol (2.5 MG/3ML) 0.083% nebulizer solution   Commonly known as:  PROVENTIL            Where to Get Your Medications      You can get these medications from any pharmacy     Bring a paper prescription for each of these medications    - acyclovir 200 MG capsule  - levETIRAcetam 500 MG tablet              Hospital Course   Presentation History   [name] [name] is a 19 y.o. female without past medical history who presents to the hospital 01/28/15 with AMS, fever, headache, Nausea and vomiting, who seized in the EDx3 with altered mental status and she has been having 3-4 days of headache and vomiting. 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 She developed a headache that started 12/12, and she started having recurrent vomiting on 12/13. Patient went to urgent care on Wednesday, 12/14 and was diagnosed with mono via blood test and she was given Zofran, which she could not keep down. Patient developed altered mental status on 12/15 around 1500 and family took her to the ED where she had 3 witnessed  general tonic-clonic seizures. Did not return to baseline in between. Patient received Ativan and Keppra and was intubated for airway protection and transferred to [facility] from [facility]. Patient's mother reported that of the seizures that she witnessed the 1st one was a long general tonic-clonic seizure that she reported lasted for several minutes. The 2nd two seizures were shorter but still general tonic-clonic. Patient's mother reports that she had 1 febrile seizure as a child. She reports that the patient's cousin has a seizure history, but his seizures stopped before the age of 86. Mother does not note any sick contacts, but reports that she has not yet called her daughter's roommates as they are likely still asleep at 10:00 AM.    Pt had an LP done at [facility] ER, CSF results are not documented in our system.     See HPI for details.    Hospital Course   Following transfer, she was seen by neurology consult, was placed on Acyclovir, Keppa and Rocephin as well  Vancomycin, CSF results from [facility] hospital showed aseptic meningitis, so Vanc and Rocephin were discontinued, patient then got extubated on 12/18, EEG was negative for seizure activities, EEG got disconnected, Keppra was continued at 1000 mg BID.     Patient, remained stable, per neurology she will go home with completion of 10 days of acyclovir, PT/OT had seen her who cleared her to go home in stable condition.    Patient will follow with neurology as outpatient.     Procedures:   XR Chest AP Portable   Final Result    Mild left basal atelectasis improving from the prior study.   No new or progressive airspace opacity or other acute abnormality.      Annabell Sabal, MD    01/30/2015 10:38 AM         MRI Brain W WO Contrast   Final Result      1. The temporal lobes are normal.    2. No subarachnoid space abnormality is identified.   3. No structural fluid collection, cerebritis, or other worrisome   process.            Trilby Drummer, MD    01/29/2015 10:54 AM         XR Chest AP Portable (Single View) ONCE ET/NG/OG tube and line placement   Final Result           1.  The endotracheal tube tip is 3 cm above the carina.   2.  The enteric tube tip is at the gastric antrum.   3.  Minimal left basilar atelectasis.      Demetrios Isaacs, MD    01/28/2015 8:56 AM         CT Imported Outside Images   Preliminary Result          Consultants  Treatment Team: Attending Provider: Tery Sanfilippo, MD; Case Manager: Sindy Messing; Technician: Eden Emms Registered Nurse: Rudy Jew, RN      Best Practices   Was the patient admitted with either a CHF Exacerbation or Pneumonia? No     Progress Note/Physical Exam at Discharge     Subjective: no complaint     Filed Vitals:    02/04/15 0356 02/04/15 0737 02/04/15 1125 02/04/15 1525   BP: 106/58 109/59 121/76 100/54   Pulse: 70 75 75 68   Temp: 97.8 F (36.6 C) 98.2 F (36.8 C) 97.9 F (36.6 C) 98.6 F (37 C)   TempSrc: Oral Oral Oral Oral   Resp: 18 18 18 18    Height:       Weight:       SpO2: 98% 98% 92% 99%       General: NAD, AAOx3  HEENT: perrla, eomi, sclera anicteric, OP: Clear, MMM  Neck: supple, FROM, no LAD  Cardiovascular: RRR, no m/r/g  Lungs: CTAB, no w/r/r  Abdomen: soft, +BS, NT/ND, no masses, no g/r  Extremities: no C/C/E  Neuro: no neurologic deficit, power 5/5 bilaterally, +2 reflexes on extremities.   Skin: no rashes or lesions noted    Admission Condition: critical  Discharge Condition: good  Functional Status: Patient is independent with mobility/ambulation, transfers, ADL's, IADL's.     Diagnostics     Labs/Studies Pending at Discharge: No    Last Labs     Recent Labs  Lab 02/04/15  0130 02/03/15  0848 02/02/15  0857   WBC 7.17 9.66 10.41   RBC 3.93* 4.13* 3.92*  HGB 11.0* 11.4* 11.2*   HEMATOCRIT 32.7* 34.0* 32.2*   MCV 83.2 82.3 82.1   PLATELETS 295 282 257         Recent Labs  Lab 02/04/15  0130 02/03/15  0848 02/02/15  0857 02/01/15  0507 01/31/15  0505   SODIUM 142  139 139 138 140   POTASSIUM 3.5 3.1* 3.3* 3.8 3.7   CHLORIDE 107 105 107 107 107   CO2 27 25 21* 16* 18*   BUN 2.0* 2.0* 3.0* 8.0 9.0   CREATININE 0.7 0.7 0.7 0.8 0.7   GLUCOSE 123* 86 102* 68* 69*   CALCIUM 8.5 8.2* 8.2* 8.5 8.5   MAGNESIUM 1.8  --   --   --   --              Microbiology Results     Procedure Component Value Units Date/Time    CULTURE + Dierdre Forth [[id number]] Collected:  01/30/15 0121    Specimen Information:  Sputum from Bronchial Brushing Updated:  02/01/15 0842    Narrative:      ORDER#: [id number]                                    ORDERED BY: Nancie Neas  SOURCE: Bronchial Brushing et                        COLLECTED:  01/30/15 01:21  ANTIBIOTICS AT COLL.:                                RECEIVED :  01/30/15 04:38  Stain, Gram (Respiratory)                  FINAL       01/30/15 06:23  01/30/15   No Epithelial cells             Few WBC's             No organisms seen  Culture and Gram Stain, Aerobic, RespiratorFINAL       02/01/15 08:42  02/01/15   No growth      CULTURE BLOOD AEROBIC AND ANAEROBIC [[id number]] Collected:  01/30/15 0136    Specimen Information:  Blood, Venipuncture Updated:  02/04/15 0721    Narrative:      ORDER#: [id number]                                    ORDERED BY: Nancie Neas  SOURCE: Blood, Venipuncture peripheral               COLLECTED:  01/30/15 01:36  ANTIBIOTICS AT COLL.:                                RECEIVED :  01/30/15 04:37  Culture Blood Aerobic and Anaerobic        FINAL       02/04/15 07:21  02/04/15   No growth after 5 days of incubation.      CULTURE BLOOD AEROBIC AND ANAEROBIC [[id number]] Collected:  01/30/15 0121    Specimen Information:  Blood, Intravenous Line Updated:  02/04/15 0721    Narrative:      ORDER#: [id number]  ORDERED BY: ALTAWEEL, LAITH  SOURCE: Blood, Intravenous Line tlc                  COLLECTED:  01/30/15 01:21  ANTIBIOTICS AT COLL.:                                RECEIVED :   01/30/15 04:37  Culture Blood Aerobic and Anaerobic        FINAL       02/04/15 07:21  02/04/15   No growth after 5 days of incubation.      MRSA culture [[id number]] Collected:  01/28/15 0317    Specimen Information:  Body Fluid from Nasal/Throat ASC Admission Updated:  01/29/15 0840    Narrative:      ORDER#: [id number]                                    ORDERED BY: Herminio Heads  SOURCE: Nares and Throat                             COLLECTED:  01/28/15 03:17  ANTIBIOTICS AT COLL.:                                RECEIVED :  01/28/15 06:40  Culture MRSA Surveillance                  FINAL       01/29/15 08:40  01/29/15   Negative for Methicillin Resistant Staph aureus from Nares and             Negative for Methicillin Resistant Staph aureus from Throat      Urine culture [[id number]] Collected:  01/30/15 0121    Specimen Information:  Bladder Urine Updated:  01/31/15 1257    Narrative:      ORDER#: [id number]                                    ORDERED BY: Nancie Neas  SOURCE: Urine                                        COLLECTED:  01/30/15 01:21  ANTIBIOTICS AT COLL.:                                RECEIVED :  01/30/15 14:39  Culture Urine                              FINAL       01/31/15 12:57  01/31/15   No growth of >1,000 CFU/ML, No further work             Patient Instructions   Discharge Diet: regular diet  Discharge Activity:  activity as tolerated  Discharge instructions:   Discharge Code Status: Full code   Additional instructions -   Disposition:  Home.   Follow Up Appointment:  See Valentino Nose, MD in 1-2 weeks.  See Dr.  Kurukumbi , neurology, in 3-4 weeks.       Time spent examining patient, discussing with patient/family regarding hospital course, chart review, reconciling medications and discharge planning:  total time : 31 minutes.    Carmelina Noun, MD  6:17 PM 02/04/2015     CC to: Valentino Nose, MD

## 2015-02-04 NOTE — Plan of Care (Signed)
Patient discharged home with parents at 57. Discharge instructions provided on follow up care and medications. Pt. And family verbalize understanding of instructions

## 2015-02-04 NOTE — Plan of Care (Signed)
IV acyclovir administered with no adverse effects, OOB to BR with 1 person assist, Mother and father at bedside, PO tolerated, O2 RA, No c/o pain, No neuro changes, One loose stool but unable to obtain specimen d/t mixing with urine - mother stated firmer in texture than previous BMs, Will continue to monitor for needs and safety, Call light in reach, Falls and seizure precautions in use.

## 2015-02-04 NOTE — Plan of Care (Signed)
Patient alert x4, flat affect, compliant with safety precautions in place, denies pain, poor PO intake improved today: ate 1/2 of all three meals. Family at bedside. Patient to be discharged today.

## 2015-02-04 NOTE — Progress Notes (Signed)
[facility] Consult Progress Note    Date Time: 02/04/2015   Patient Name: [name] [name],[name] [name]  Outpatient Neurologist: none    CC: headache, AMS    Assessment:   New onset seizures in a 19 y.o. AAF with no PMH who presents 01/27/15 with AMS followed by new onset seizures x3 in setting of 3-4 days of headache and vomiting. +Mono at urgent care. S/p intubation at OSH, extubated 12/18.    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 01/27/15, lab # [id number] (micro):    Clear, colorless   Tube 4: RBC 0, WBC 27 (2 seg, 70 lymph, 28 mono), protein 247.9, glucose 75   Bacterial cx/GS: no growth @ 48h reported as of 12/18, GS: few WBCs, no organisms seen        Most likely viral etiology based on CSF profile.         MRI brain wwo 12/17 was normal.    2. New onset seizures, 2/2 #1  Stable, on Keppra started at OSH.    3. Encephalopathy - post-ictal vs medication effect vs meningoencephalitis related.  Has not had seizures on cvEEG 12/16-12/18, but EEG is very slow even post extubation.    Recommendations:   - Continue Keppra 1000mg  BID  - Continue acyclovir until HSV results return. Bacterial meningitis coverage has been d/c'd.  - Continue medical management by primary team  - Supportive therapies: PT/OT/SLP  - Patient is likely O.K. For discharge from a Neurological stand point  - Follow up with Neurology as an outpatient in 3-4 weeks where Keppra will likely be titrated down    Neurology Attending Addendum:    I have personally reviewed the interval history, images and pertinent test results and I have personally examined the patient and confirmed the major physical findings of the preceding NP/PA note.  Also, I have noted any changes since the note was written as well as added additional findings and recommendations.    [name] [name] is evaluated with her family present.  She is doing quite well.  .  There are no noted neurological deficits.  She  does speak and converses well, no weakness.  No cerebellar lesion.  .  Her meningoencephalitis is nearly if not completely resolved at this time.  .  She has completed her course of antiviral therapy and further neurological viewpoint is stable for discharge when medically cleared.  .  This is discussed with she and her family and their concerns and questions were fully addressed.    Sinclair Ship, MD  [facility]  Neuro-hospitalist  [facility]  [employer] [phone]        Interval History/Subjective:   No acute neurological events reported or documented overnight.  Patient is awake, alert, answering questions and following commands appropriately.  Patient seems to have returned to her baseline mental status. Patient has passed her swallow study and is now on a regular diet. She denies headache, dizziness, n/v, weakness, paresthesias and visual disturbance.    Medications:     Current Facility-Administered Medications   Medication Dose Route Frequency   . acyclovir  10 mg/kg (Order-Specific) Intravenous Q8H   . chlorhexidine  15 mL Mouth/Throat Q12H   . enoxaparin  40 mg Subcutaneous Daily   . levETIRAcetam  1,000 mg Intravenous Q12H   . senna  17.6 mg Oral BID       Review of Systems:   Neurological ROS negative  except for that noted in the History/Subjective.    Physical Exam:   Temp:  [97.8 F (36.6 C)-98.9 F (37.2 C)] 98.2 F (36.8 C)  Heart Rate:  [67-77] 75  Resp Rate:  [18-23] 18  BP: (92-113)/(54-82) 109/59 mmHg    Vital Signs: Reviewed.    General: well developed, well nourished young woman.  CV: regular rate and rhythm  Resp: breathing comfortably on nasal cannula    Mental Status: eyes open to voice, tracks me but seems to regard her parents sitting to her right. Patient reporting her name and location.  Speaking normally.  Follows commands.     Cranial nerves: PERRL. Gaze is conjugate, no preference of deviation noted. [name] is symmetric.     Motor: Muscle tone normal.  Withdraws briskly to pain x4, seems to be moving both sides equally.  Muscle strength seems to be 4/5 throughout bilateral upper and lower extremities.    Sensory: Light touch intact  Vibration intact  Temperature intact  Proprioception intact    Reflexes: Reflexes were +1 throughout bilateral upper and lower extremities Toes downgoing bilaterally.     Coordination: FTN intact but slow.  No tremors    Gait: Deferred due to concern for patient safety    Labs:     Results     Procedure Component Value Units Date/Time    CULTURE BLOOD AEROBIC AND ANAEROBIC [[id number]] Collected:  01/30/15 0136    Specimen Information:  Blood, Venipuncture Updated:  02/04/15 0721    Narrative:      ORDER#: [id number]                                    ORDERED BY: Nancie Neas  SOURCE: Blood, Venipuncture peripheral               COLLECTED:  01/30/15 01:36  ANTIBIOTICS AT COLL.:                                RECEIVED :  01/30/15 04:37  Culture Blood Aerobic and Anaerobic        FINAL       02/04/15 07:21  02/04/15   No growth after 5 days of incubation.      CULTURE BLOOD AEROBIC AND ANAEROBIC [[id number]] Collected:  01/30/15 0121    Specimen Information:  Blood, Intravenous Line Updated:  02/04/15 0721    Narrative:      ORDER#: [id number]                                    ORDERED BY: Nancie Neas  SOURCE: Blood, Intravenous Line tlc                  COLLECTED:  01/30/15 01:21  ANTIBIOTICS AT COLL.:                                RECEIVED :  01/30/15 04:37  Culture Blood Aerobic and Anaerobic        FINAL       02/04/15 07:21  02/04/15   No growth after 5 days of incubation.      Urinalysis with microscopic [[id number]] Collected:  02/04/15 0130    Specimen Information:  Urine  Updated:  02/04/15 0212     Urine Type Clean Catch      Color, UA Straw      Clarity, UA Clear      Specific Gravity UA 1.008      Urine pH 7.0      Leukocyte Esterase, UA Negative      Nitrite, UA Negative      Protein, UR Negative      Glucose, UA Negative       Ketones UA Negative      Urobilinogen, UA Normal mg/dL      Bilirubin, UA Negative      Blood, UA Negative      RBC, UA 0 - 5 /hpf      Squamous Epithelial Cells, Urine 0 - 5 /hpf     Magnesium [[id number]] Collected:  02/04/15 0130    Specimen Information:  Blood Updated:  02/04/15 0201     Magnesium 1.8 mg/dL     Phosphorus [[id number]] Collected:  02/04/15 0130    Specimen Information:  Blood Updated:  02/04/15 0201     Phosphorus 3.0 mg/dL     Basic Metabolic Panel [[id number]]  (Abnormal) Collected:  02/04/15 0130    Specimen Information:  Blood Updated:  02/04/15 0201     Glucose 123 (H) mg/dL      BUN 2.0 (L) mg/dL      Creatinine 0.7 mg/dL      Calcium 8.5 mg/dL      Sodium 784 mEq/L      Potassium 3.5 mEq/L      Chloride 107 mEq/L      CO2 27 mEq/L     CBC without differential [[id number]]  (Abnormal) Collected:  02/04/15 0130    Specimen Information:  Blood from Blood Updated:  02/04/15 0152     WBC 7.17 x10 3/uL      Hgb 11.0 (L) g/dL      Hematocrit 13.2 (L) %      Platelets 295 x10 3/uL      RBC 3.93 (L) x10 6/uL      MCV 83.2 fL      MCH 28.0 pg      MCHC 33.6 g/dL      RDW 15 %      MPV 10.5 fL      Nucleated RBC 0 /100 WBC     Basic Metabolic Panel [[id number]]  (Abnormal) Collected:  02/03/15 0848    Specimen Information:  Blood Updated:  02/03/15 1011     Glucose 86 mg/dL      BUN 2.0 (L) mg/dL      Creatinine 0.7 mg/dL      Calcium 8.2 (L) mg/dL      Sodium 366 mEq/L      Potassium 3.1 (L) mEq/L      Chloride 105 mEq/L      CO2 25 mEq/L           Rads:   Mri Brain W Wo Contrast    01/29/2015  1. The temporal lobes are normal. 2. No subarachnoid space abnormality is identified. 3. No structural fluid collection, cerebritis, or other worrisome process. Trilby Drummer, MD 01/29/2015 10:54 AM     Xr Chest Ap Portable    01/30/2015   Mild left basal atelectasis improving from the prior study. No new or progressive airspace opacity or other acute abnormality. Annabell Sabal, MD 01/30/2015 10:38 AM          Signed by:  Kathrine Cords. Jessee Avers, FNP-BC  Nurse Practitioner  [facility]  Pager: [place] [phone]  After Hours: [phone]    Please see attending Neurologist note that accompanies this mid-level encounter note.

## 2015-02-18 NOTE — Retrospective Coding Query (Signed)
PHYSICIAN'S DOCUMENTATION                                                                      REQUEST                                                                         Date of Request:  02/18/2015  Type of Request:  DOCUMENTATION CLARIFICATION                                         Patient Name: [name] [name],[name] [name]  Account #: [id number]  MR #: [id number]  Discharge Date: 02/04/2015      Dear Dr. Cherlynn June     The medical record reflects the following : Patient admitted with meningitis, mono and seizures.  Dr. Gar Gibbon Progress notes starting on 12/17 reflect sepsis. Dr. Gar Gibbon 12/19 PN : H flu pneumonia - on Ceftriaxone  Sepsis and H flu pneumonia are not reflected in the discharge summary         Question to Physician: Please clarify if     _ Sepsis was ruled in or out at the time of discharge  _ H flu Pneumonia was ruled in or out at the time of discharge     Thank You           PHYSICIAN RESPONSE:      Sepsis was ruled in or out at the time of discharge        Idriss Quackenbush Edgardo Roys, MD

## 2015-02-21 NOTE — Retrospective Coding Query (Signed)
PHYSICIAN'S DOCUMENTATION                                                                      REQUEST                                                                         Date of Request:  02/21/2015  Type of Request:  DOCUMENTATION CLARIFICATION                                         Patient Name: [name] [name],[name] [name]  Account #: [id number]  MR #: [id number]  Discharge Date: 02/04/2015      Dear Dr. Cherlynn June     The medical record reflects the following : Patient admitted with meningitis, mono and seizures. Dr. Gar Gibbon Progress notes starting on 12/17 reflect sepsis. Dr. Gar Gibbon 12/19 PN : H flu pneumonia - on Ceftriaxone  Sepsis and H flu pneumonia are not reflected in the discharge summary         Question to Physician:  Please clarify if -    _ Sepsis was ruled in   _ Sepsis ruled out  _ H flu Pneumonia was ruled in   _ H flu Pneumonia ruled out       Thank You       PHYSICIAN RESPONSE:        Sepsis and H influenzae pneumonia were ruled out      Glyn Gerads Edgardo Roys, MD

## 2015-03-14 ENCOUNTER — Ambulatory Visit (INDEPENDENT_AMBULATORY_CARE_PROVIDER_SITE_OTHER): Payer: BLUE CROSS/BLUE SHIELD | Admitting: Neurology

## 2017-12-19 ENCOUNTER — Ambulatory Visit: Payer: Self-pay | Admitting: Nurse Practitioner

## 2017-12-19 VITALS — BP 116/80 | HR 84 | Temp 98.8°F | Resp 20 | Wt 130.8 lb

## 2017-12-19 DIAGNOSIS — B9689 Other specified bacterial agents as the cause of diseases classified elsewhere: Secondary | ICD-10-CM

## 2017-12-19 DIAGNOSIS — N760 Acute vaginitis: Secondary | ICD-10-CM

## 2017-12-19 MED ORDER — METRONIDAZOLE 500 MG PO TABS: 500.0000 mg | ORAL_TABLET | Freq: Two times a day (BID) | ORAL | 0 refills | Status: AC

## 2017-12-19 NOTE — Patient Instructions (Signed)
Bacterial Vaginosis -Take medication as prescribed. -Always use protection with sexual intercourse. -Do not have sexual intercourse until symptoms have resolved. -Keep the vaginal area clean and dry.  It is best to use warm water and avoid deodorants, body washes, or soaps. -Follow-up with PCP if symptoms do not improve.  At that time time may recommend STD/STI testing. -Follow-up in our clinic as needed.  Bacterial vaginosis is a vaginal infection that occurs when the normal balance of bacteria in the vagina is disrupted. It results from an overgrowth of certain bacteria. This is the most common vaginal infection among women ages 87-44. Because bacterial vaginosis increases your risk for STIs (sexually transmitted infections), getting treated can help reduce your risk for chlamydia, gonorrhea, herpes, and HIV (human immunodeficiency virus). Treatment is also important for preventing complications in pregnant women, because this condition can cause an early (premature) delivery. What are the causes? This condition is caused by an increase in harmful bacteria that are normally present in small amounts in the vagina. However, the reason that the condition develops is not fully understood. What increases the risk? The following factors may make you more likely to develop this condition:  Having a new sexual partner or multiple sexual partners.  Having unprotected sex.  Douching.  Having an intrauterine device (IUD).  Smoking.  Drug and alcohol abuse.  Taking certain antibiotic medicines.  Being pregnant.  You cannot get bacterial vaginosis from toilet seats, bedding, swimming pools, or contact with objects around you. What are the signs or symptoms? Symptoms of this condition include:  Grey or white vaginal discharge. The discharge can also be watery or foamy.  A fish-like odor with discharge, especially after sexual intercourse or during menstruation.  Itching in and around the  vagina.  Burning or pain with urination.  Some women with bacterial vaginosis have no signs or symptoms. How is this diagnosed? This condition is diagnosed based on:  Your medical history.  A physical exam of the vagina.  Testing a sample of vaginal fluid under a microscope to look for a large amount of bad bacteria or abnormal cells. Your health care provider may use a cotton swab or a small wooden spatula to collect the sample.  How is this treated? This condition is treated with antibiotics. These may be given as a pill, a vaginal cream, or a medicine that is put into the vagina (suppository). If the condition comes back after treatment, a second round of antibiotics may be needed. Follow these instructions at home: Medicines  Take over-the-counter and prescription medicines only as told by your health care provider.  Take or use your antibiotic as told by your health care provider. Do not stop taking or using the antibiotic even if you start to feel better. General instructions  If you have a female sexual partner, tell her that you have a vaginal infection. She should see her health care provider and be treated if she has symptoms. If you have a female sexual partner, he does not need treatment.  During treatment: ? Avoid sexual activity until you finish treatment. ? Do not douche. ? Avoid alcohol as directed by your health care provider. ? Avoid breastfeeding as directed by your health care provider.  Drink enough water and fluids to keep your urine clear or pale yellow.  Keep the area around your vagina and rectum clean. ? Wash the area daily with warm water. ? Wipe yourself from front to back after using the toilet.  Keep all  follow-up visits as told by your health care provider. This is important. How is this prevented?  Do not douche.  Wash the outside of your vagina with warm water only.  Use protection when having sex. This includes latex condoms and dental  dams.  Limit how many sexual partners you have. To help prevent bacterial vaginosis, it is best to have sex with just one partner (monogamous).  Make sure you and your sexual partner are tested for STIs.  Wear cotton or cotton-lined underwear.  Avoid wearing tight pants and pantyhose, especially during summer.  Limit the amount of alcohol that you drink.  Do not use any products that contain nicotine or tobacco, such as cigarettes and e-cigarettes. If you need help quitting, ask your health care provider.  Do not use illegal drugs. Where to find more information:  Centers for Disease Control and Prevention: SolutionApps.co.za  American Sexual Health Association (ASHA): www.ashastd.org  U.S. Department of Health and Health and safety inspector, Office on Women's Health: ConventionalMedicines.si or http://www.anderson-williamson.info/ Contact a health care provider if:  Your symptoms do not improve, even after treatment.  You have more discharge or pain when urinating.  You have a fever.  You have pain in your abdomen.  You have pain during sex.  You have vaginal bleeding between periods. Summary  Bacterial vaginosis is a vaginal infection that occurs when the normal balance of bacteria in the vagina is disrupted.  Because bacterial vaginosis increases your risk for STIs (sexually transmitted infections), getting treated can help reduce your risk for chlamydia, gonorrhea, herpes, and HIV (human immunodeficiency virus). Treatment is also important for preventing complications in pregnant women, because the condition can cause an early (premature) delivery.  This condition is treated with antibiotic medicines. These may be given as a pill, a vaginal cream, or a medicine that is put into the vagina (suppository). This information is not intended to replace advice given to you by your health care provider. Make sure you discuss any questions you have with your health care  provider. Document Released: 01/29/2005 Document Revised: 06/04/2016 Document Reviewed: 10/15/2015 Elsevier Interactive Patient Education  [employer].

## 2017-12-19 NOTE — Progress Notes (Signed)
Subjective:     Anna Eaton is a 22 y.o. female who presents for evaluation of an abnormal vaginal discharge. Symptoms have been present for 2 days. Vaginal symptoms: discharge described as white and malodorous. Contraception: none. She denies abnormal bleeding, blisters, bumps, dyspareunia, local irritation, pain and urinary symptoms of chills, hematuria, urinary frequency, urinary hesitancy and urinary urgency Sexually transmitted infection risk: possible STD exposure and patient states she has unprotected sex approximately 2 weeks ago.. Menstrual flow: regular every 28-30 days.   The following portions of the patient's history were reviewed and updated as appropriate: allergies, current medications and past medical history.   Review of Systems Constitutional: negative Ears, nose, mouth, throat, and face: negative Respiratory: negative Cardiovascular: negative Gastrointestinal: negative Genitourinary:positive for vaginal discharge, negative for dysuria, frequency, hematuria and hesitancy Neurological: negative    Objective:    BP 116/80 (BP Location: Right Arm, Patient Position: Sitting, Cuff Size: Normal)   Pulse 84   Temp 98.8 F (37.1 C) (Oral)   Resp 20   Wt 130 lb 12.8 oz (59.3 kg)   SpO2 98%   BMI 23.17 kg/m  General appearance: alert, cooperative and no distress Head: Normocephalic, without obvious abnormality, atraumatic Eyes: conjunctivae/corneas clear. PERRL, EOM's intact. Fundi benign. Ears: normal TM's and external ear canals both ears Nose: Nares normal. Septum midline. Mucosa normal. No drainage or sinus tenderness. Throat: lips, mucosa, and tongue normal; teeth and gums normal Lungs: clear to auscultation bilaterally Heart: regular rate and rhythm, S1, S2 normal, no murmur, click, rub or gallop Abdomen: soft, non-tender; bowel sounds normal; no masses,  no organomegaly Pelvic: cervix normal in appearance, external genitalia normal, no adnexal masses or  tenderness and no cervical motion tenderness Pulses: 2+ and symmetric Skin: Skin color, texture, turgor normal. No rashes or lesions    Assessment:    Bacterial vaginosis.    Plan:   Exam findings, diagnosis etiology and medication use and indications reviewed with patient. Follow- Up and discharge instructions provided. No emergent/urgent issues found on exam. Patient education was provided. Patient verbalized understanding of information provided and agrees with plan of care (POC), all questions answered. The patient is advised to call or return to clinic if condition does not see an improvement in symptoms, or to seek the care of the closest emergency department if condition worsens with the above plan.   1. Bacterial vaginosis  - metroNIDAZOLE (FLAGYL) 500 MG tablet; Take 1 tablet (500 mg total) by mouth 2 (two) times daily for 7 days.  Dispense: 14 tablet; Refill: 0 -Take medication as prescribed. -Always use protection with sexual intercourse. -Do not have sexual intercourse until symptoms have resolved. -Keep the vaginal area clean and dry.  It is best to use warm water and avoid deodorants, body washes, or soaps. -Follow-up with PCP if symptoms do not improve.  At that time time may recommend STD/STI testing. -Follow-up in our clinic as needed.

## 2018-01-19 ENCOUNTER — Emergency Department (HOSPITAL_COMMUNITY): Payer: Federal, State, Local not specified - PPO

## 2018-01-19 ENCOUNTER — Encounter (HOSPITAL_COMMUNITY): Payer: Self-pay | Admitting: Emergency Medicine

## 2018-01-19 ENCOUNTER — Other Ambulatory Visit: Payer: Self-pay

## 2018-01-19 ENCOUNTER — Emergency Department (HOSPITAL_COMMUNITY)
Admission: EM | Admit: 2018-01-19 | Discharge: 2018-01-19 | Disposition: A | Discharge status: Home / Self Care | Payer: Federal, State, Local not specified - PPO | Attending: Emergency Medicine | Admitting: Emergency Medicine

## 2018-01-19 DIAGNOSIS — R002 Palpitations: Secondary | ICD-10-CM | POA: Insufficient documentation

## 2018-01-19 DIAGNOSIS — J45909 Unspecified asthma, uncomplicated: Secondary | ICD-10-CM | POA: Diagnosis not present

## 2018-01-19 DIAGNOSIS — R000 Tachycardia, unspecified: Secondary | ICD-10-CM | POA: Diagnosis present

## 2018-01-19 LAB — POCT I-STAT, CHEM 8
BUN: 13 mg/dL (ref 6–20)
Calcium, Ion: 1.17 mmol/L (ref 1.15–1.40)
Chloride: 100 mmol/L (ref 98–111)
Creatinine, Ser: 0.8 mg/dL (ref 0.44–1.00)
Glucose, Bld: 109 mg/dL — ABNORMAL HIGH (ref 70–99)
HEMATOCRIT: 41 % (ref 36.0–46.0)
HEMOGLOBIN: 13.9 g/dL (ref 12.0–15.0)
Potassium: 3.1 mmol/L — ABNORMAL LOW (ref 3.5–5.1)
Sodium: 139 mmol/L (ref 135–145)
TCO2: 29 mmol/L (ref 22–32)

## 2018-01-19 LAB — I-STAT BETA HCG BLOOD, ED (NOT ORDERABLE): I-stat hCG, quantitative: <5 m[IU]/mL (ref <5)

## 2018-01-19 LAB — POCT I-STAT TROPONIN I: Troponin i, poc: 0 ng/mL (ref 0.00–0.08)

## 2018-01-19 MED ORDER — IBUPROFEN 800 MG PO TABS
800.0000 mg | ORAL_TABLET | Freq: Once | ORAL | Status: AC
  Administered 2018-01-19: 800 mg via ORAL
  Filled 2018-01-19: qty 1

## 2018-01-19 MED ORDER — HYDROXYZINE HCL 25 MG PO TABS
25.0000 mg | ORAL_TABLET | Freq: Once | ORAL | Status: AC
  Administered 2018-01-19: 25 mg via ORAL
  Filled 2018-01-19: qty 1

## 2018-01-19 MED ORDER — POTASSIUM CHLORIDE CRYS ER 20 MEQ PO TBCR
40.0000 meq | EXTENDED_RELEASE_TABLET | Freq: Once | ORAL | Status: AC
  Administered 2018-01-19: 40 meq via ORAL
  Filled 2018-01-19: qty 2

## 2018-01-19 NOTE — ED Notes (Signed)
Pt dressed into gown but requested to leave pants/shoes on.

## 2018-01-19 NOTE — ED Provider Notes (Signed)
[facility] Provider Note   CSN: [id number] Arrival date & time: 01/19/18  0105    History   Chief Complaint Chief Complaint  Patient presents with  . Tachycardia    HPI Darlin PriestlySienna Tarpley is a 22 y.o. female.  22 year old female with history of allergies, asthma, anxiety presents to the emergency department for evaluation of palpitations.  She states that she feels as though her heart is racing.  This began at 1600 tonight.  It has remained constant, but she has developed a increasing discomfort in her central chest.  Highest heart rate calculated prior to arrival was 92 bpm.  The patient has not had any syncope, near syncope, fevers, nausea, vomiting, leg swelling, hemoptysis.  No recent surgeries or hospitalizations.  She is currently on Lexapro for management of her anxiety.  Denies any increased life stressors.  The history is provided by the patient. No language interpreter was used.    Past Medical History:  Diagnosis Date  . Allergy   . Anxiety   . Asthma     There are no active problems to display for this patient.   Past Surgical History:  Procedure Laterality Date  . KNEE ARTHROSCOPY Right      OB History   None      Home Medications    Prior to Admission medications   Medication Sig Start Date End Date Taking? Authorizing Provider  fluticasone (FLONASE) 50 MCG/ACT nasal spray Place 2 sprays into both nostrils daily. Patient not taking: Reported on 12/19/2017 05/17/14   Wallis BambergMani, Mario, PA-C  loratadine (CLARITIN) 10 MG tablet Take 1 tablet (10 mg total) by mouth daily. Patient not taking: Reported on 01/19/2018 05/17/14   Wallis BambergMani, Mario, PA-C    Family History Family History  Problem Relation Age of Onset  . Hyperlipidemia Father     Social History Social History   Tobacco Use  . Smoking status: Never Smoker  . Smokeless tobacco: Never Used  Substance Use Topics  . Alcohol use: Not Currently    Alcohol/week: 0.0  standard drinks  . Drug use: Not Currently     Allergies   Patient has no known allergies.   Review of Systems Review of Systems Ten systems reviewed and are negative for acute change, except as noted in the HPI.    Physical Exam Updated Vital Signs BP 120/89 (BP Location: Left Arm)   Pulse 65   Temp 97.7 F (36.5 C) (Oral)   Resp 18   Ht 5\' 2"  (1.575 m)   Wt 59 kg   LMP 01/12/2018   SpO2 100%   BMI 23.78 kg/m   Physical Exam  Constitutional: She is oriented to person, place, and time. She appears well-developed and well-nourished. No distress.  Nontoxic appearing and in NAD  HENT:  Head: Normocephalic and atraumatic.  Eyes: Conjunctivae and EOM are normal. No scleral icterus.  Neck: Normal range of motion.  Cardiovascular: Normal rate, regular rhythm and intact distal pulses.  Pulmonary/Chest: Effort normal. No stridor. No respiratory distress. She has no wheezes. She has no rales.  Respirations even and unlabored  Musculoskeletal: Normal range of motion.  No lower extremity pitting edema.  Neurological: She is alert and oriented to person, place, and time. She exhibits normal muscle tone. Coordination normal.  Skin: Skin is warm and dry. No rash noted. She is not diaphoretic. No erythema. No pallor.  Psychiatric: She has a normal mood and affect. Her behavior is normal.  Nursing note and  vitals reviewed.    ED Treatments / Results  Labs (all labs ordered are listed, but only abnormal results are displayed) Labs Reviewed  POCT I-STAT, CHEM 8 - Abnormal; Notable for the following components:      Result Value   Potassium 3.1 (*)    Glucose, Bld 109 (*)    All other components within normal limits  POCT I-STAT TROPONIN I  I-STAT BETA HCG BLOOD, ED (NOT ORDERABLE)    EKG ED ECG REPORT   Date: 01/19/2018  Rate: 70  Rhythm: normal sinus rhythm  QRS Axis: normal  Intervals: normal  ST/T Wave abnormalities: normal  Conduction Disutrbances:none   Narrative Interpretation: Sinus rhythm  Old EKG Reviewed: none available  I have personally reviewed the EKG tracing and agree with the computerized printout as noted.   Radiology Dg Chest 2 View  Result Date: 01/19/2018 CLINICAL DATA:  Tachycardia.  nonsmokertachycardia EXAM: CHEST - 2 VIEW COMPARISON:  None. FINDINGS: Normal mediastinum and cardiac silhouette. Normal pulmonary vasculature. No evidence of effusion, infiltrate, or pneumothorax. No acute bony abnormality. IMPRESSION: Normal chest radiograph Electronically Signed   By: Genevive Bi M.D.   On: 01/19/2018 02:02    Procedures Procedures (including critical care time)  Medications Ordered in ED Medications  hydrOXYzine (ATARAX/VISTARIL) tablet 25 mg (25 mg Oral Given 01/19/18 0258)  potassium chloride SA (K-DUR,KLOR-CON) CR tablet 40 mEq (40 mEq Oral Given 01/19/18 0350)  ibuprofen (ADVIL,MOTRIN) tablet 800 mg (800 mg Oral Given 01/19/18 0350)     Initial Impression / Assessment and Plan / ED Course  I have reviewed the triage vital signs and the nursing notes.  Pertinent labs & imaging results that were available during my care of the patient were reviewed by me and considered in my medical decision making (see chart for details).     22 year old female presents for palpitations suspected to be secondary to worsening anxiety.  She has not had any hypoxia while in the ED.  Her laboratory evaluation is reassuring.  EKG is nonischemic.  Chest x-ray without evidence of pneumothorax, pleural effusion, pneumonia.  Pulmonary embolus considered; however, patient is PERC negative.  I have advised that she continue her Lexapro and discuss the possibility for as needed anxiety medications to be added to her current regimen.  This can be followed by her primary care doctor.  I do not believe further emergent work-up is indicated.  Return precautions discussed and provided. Patient discharged in stable condition with no unaddressed  concerns.   Final Clinical Impressions(s) / ED Diagnoses   Final diagnoses:  Palpitations    ED Discharge Orders    None       Antony Madura, PA-C 01/19/18 0729    Nira Conn, MD 01/19/18 [phone]

## 2018-01-19 NOTE — ED Triage Notes (Signed)
Patient states she feels like her heart is racing.

## 2019-04-27 IMAGING — CR DG CHEST 2V
2 series · 2 of 2 positions shown · non-contrast
Comparison: None.

CLINICAL DATA: Tachycardia.  nonsmokertachycardia

EXAM:
CHEST - 2 VIEW

[w chest pa]
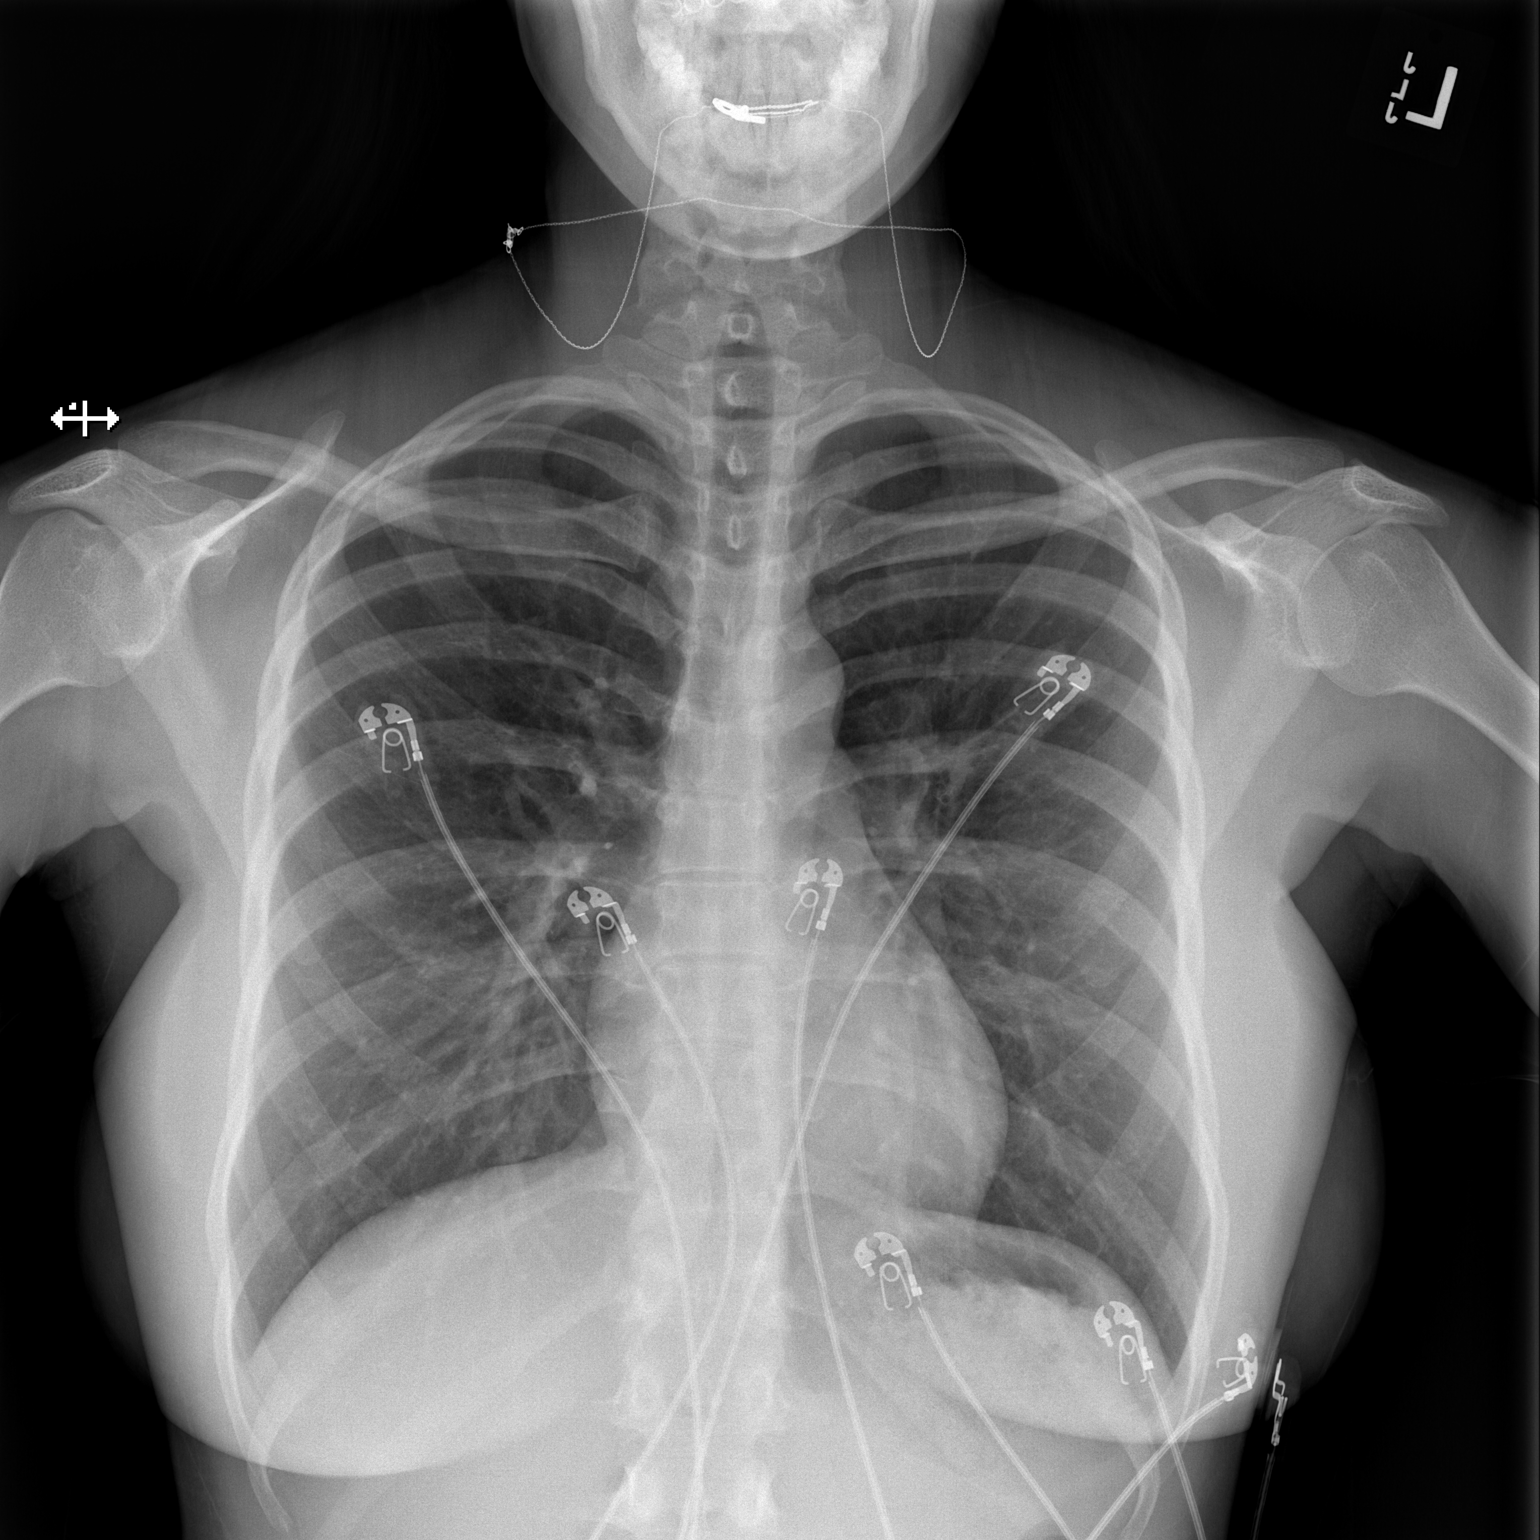

[w chest lat]
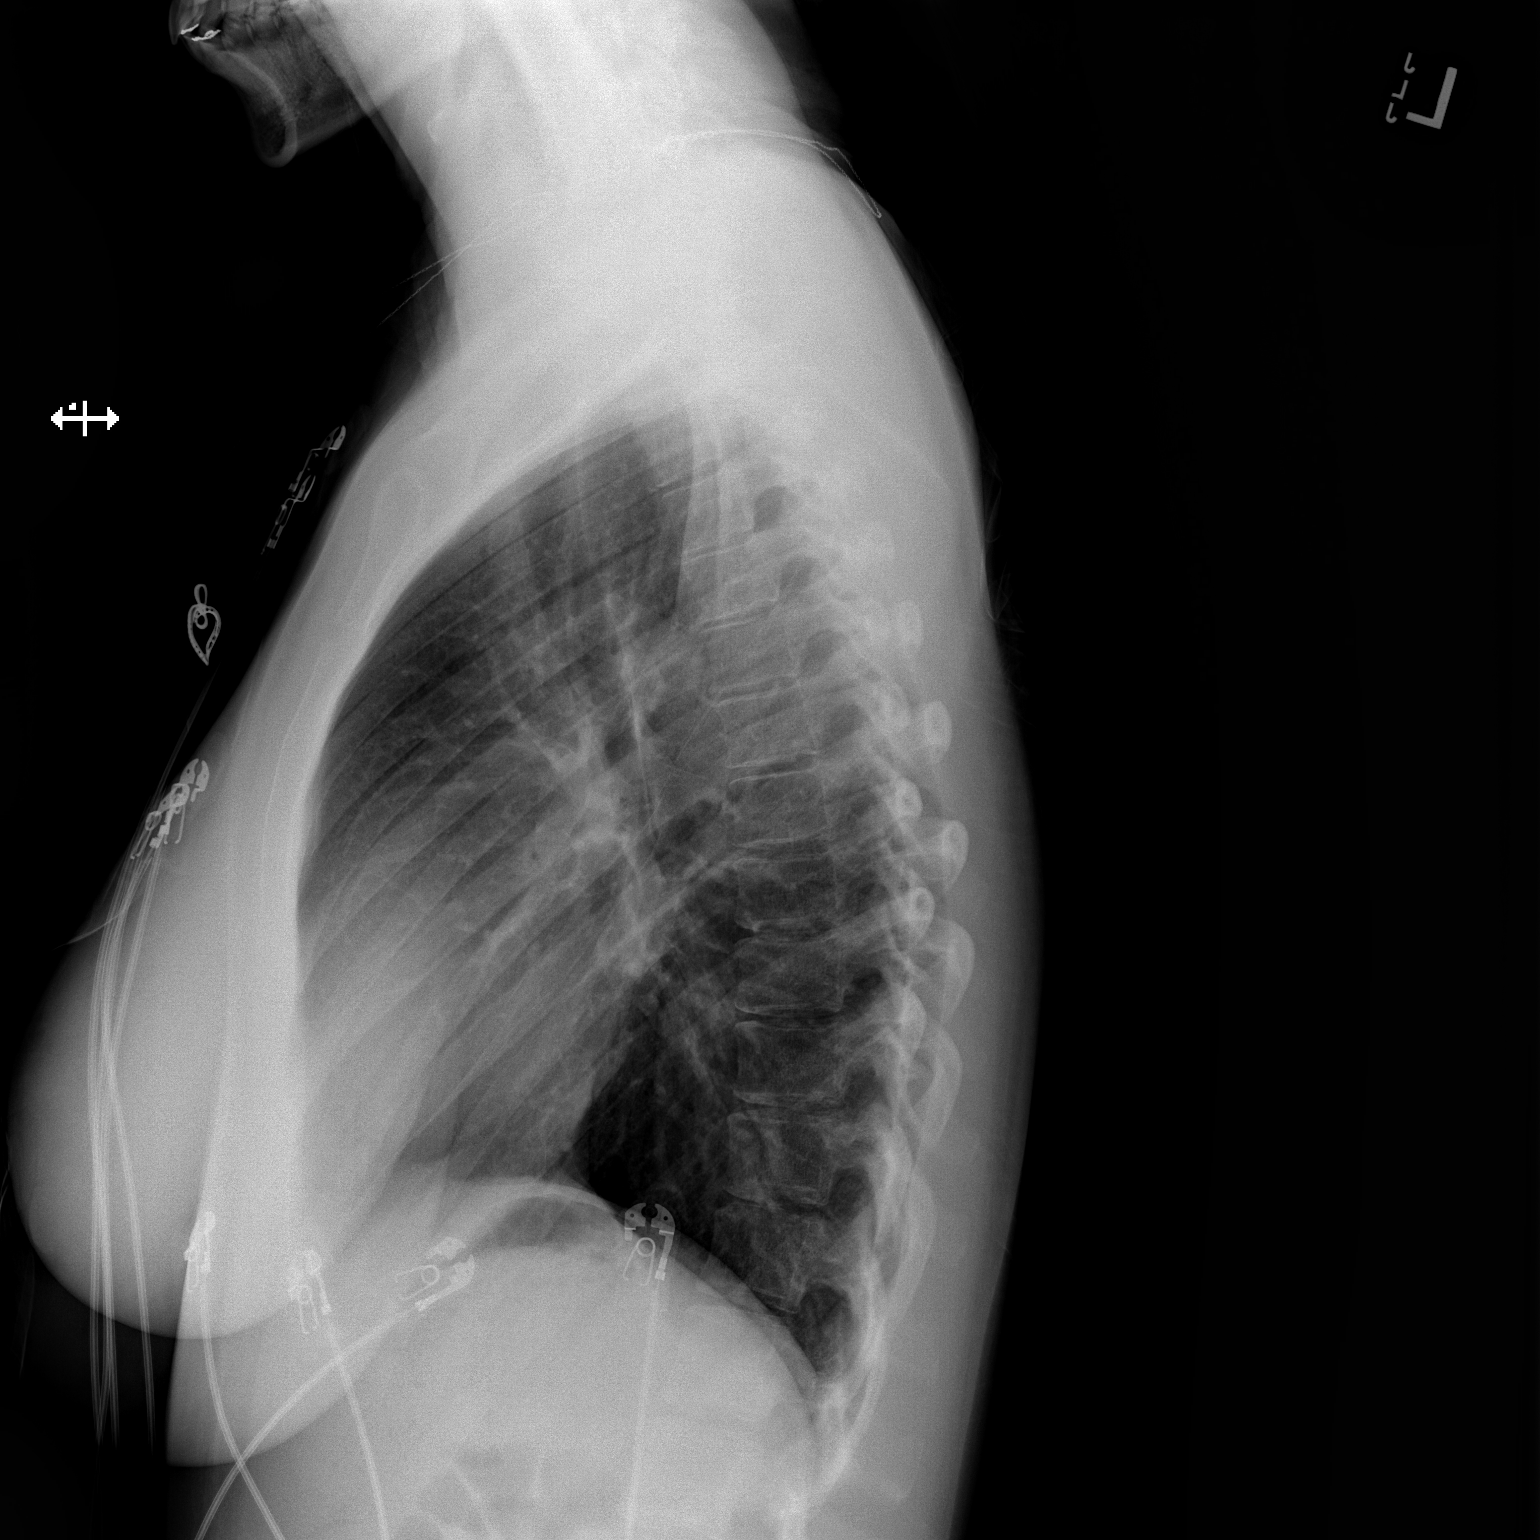

[2 of 2 positions shown; findings below may reference images not displayed]

FINDINGS: Normal mediastinum and cardiac silhouette. Normal pulmonary
vasculature. No evidence of effusion, infiltrate, or pneumothorax.
No acute bony abnormality.
IMPRESSION: Normal chest radiograph
